# Patient Record
Sex: Male | Born: 1935 | Race: Black or African American | Hispanic: No | Marital: Married | Smoking: Never smoker
Health system: Southern US, Community
[De-identification: ages and names within clinical notes are randomized; demographics above are authoritative.]

## PROBLEM LIST (undated history)

## (undated) DIAGNOSIS — F0280 Dementia in other diseases classified elsewhere without behavioral disturbance: Secondary | ICD-10-CM

## (undated) DIAGNOSIS — I2510 Atherosclerotic heart disease of native coronary artery without angina pectoris: Secondary | ICD-10-CM

## (undated) DIAGNOSIS — K219 Gastro-esophageal reflux disease without esophagitis: Secondary | ICD-10-CM

## (undated) DIAGNOSIS — E538 Deficiency of other specified B group vitamins: Secondary | ICD-10-CM

## (undated) DIAGNOSIS — G309 Alzheimer's disease, unspecified: Secondary | ICD-10-CM

## (undated) DIAGNOSIS — F329 Major depressive disorder, single episode, unspecified: Secondary | ICD-10-CM

## (undated) DIAGNOSIS — F32A Depression, unspecified: Secondary | ICD-10-CM

## (undated) DIAGNOSIS — I10 Essential (primary) hypertension: Secondary | ICD-10-CM

## (undated) DIAGNOSIS — D649 Anemia, unspecified: Secondary | ICD-10-CM

## (undated) DIAGNOSIS — I959 Hypotension, unspecified: Secondary | ICD-10-CM

## (undated) DIAGNOSIS — F4310 Post-traumatic stress disorder, unspecified: Secondary | ICD-10-CM

## (undated) DIAGNOSIS — H409 Unspecified glaucoma: Secondary | ICD-10-CM

## (undated) HISTORY — PX: MIDDLE EAR SURGERY: SHX713

## (undated) HISTORY — DX: Atherosclerotic heart disease of native coronary artery without angina pectoris: I25.10

## (undated) HISTORY — DX: Essential (primary) hypertension: I10

## (undated) HISTORY — DX: Alzheimer's disease, unspecified: G30.9

## (undated) HISTORY — DX: Gastro-esophageal reflux disease without esophagitis: K21.9

## (undated) HISTORY — DX: Dementia in other diseases classified elsewhere without behavioral disturbance: F02.80

## (undated) HISTORY — PX: THYROID SURGERY: SHX805

## (undated) HISTORY — PX: HERNIA REPAIR: SHX51

---

## 1997-12-21 ENCOUNTER — Other Ambulatory Visit: Admission: RE | Admit: 1997-12-21 | Discharge: 1997-12-21 | Payer: Self-pay | Admitting: Oncology

## 1998-04-15 ENCOUNTER — Inpatient Hospital Stay (HOSPITAL_COMMUNITY): Admission: EM | Admit: 1998-04-15 | Discharge: 1998-04-17 | Payer: Self-pay | Admitting: Emergency Medicine

## 1998-04-15 ENCOUNTER — Encounter: Payer: Self-pay | Admitting: Emergency Medicine

## 1998-04-16 ENCOUNTER — Encounter: Payer: Self-pay | Admitting: Emergency Medicine

## 1998-04-24 ENCOUNTER — Encounter: Admission: RE | Admit: 1998-04-24 | Discharge: 1998-04-24 | Payer: Self-pay | Admitting: Internal Medicine

## 1998-09-14 ENCOUNTER — Encounter: Payer: Self-pay | Admitting: Emergency Medicine

## 1998-09-14 ENCOUNTER — Inpatient Hospital Stay (HOSPITAL_COMMUNITY): Admission: EM | Admit: 1998-09-14 | Discharge: 1998-09-16 | Payer: Self-pay | Admitting: Emergency Medicine

## 1998-09-14 ENCOUNTER — Encounter: Payer: Self-pay | Admitting: Internal Medicine

## 1998-10-17 ENCOUNTER — Ambulatory Visit (HOSPITAL_COMMUNITY): Admission: RE | Admit: 1998-10-17 | Discharge: 1998-10-17 | Payer: Self-pay | Admitting: Cardiology

## 1998-12-24 ENCOUNTER — Ambulatory Visit (HOSPITAL_BASED_OUTPATIENT_CLINIC_OR_DEPARTMENT_OTHER): Admission: RE | Admit: 1998-12-24 | Discharge: 1998-12-24 | Payer: Self-pay | Admitting: General Surgery

## 1999-01-09 ENCOUNTER — Encounter: Payer: Self-pay | Admitting: Emergency Medicine

## 1999-01-09 ENCOUNTER — Emergency Department (HOSPITAL_COMMUNITY): Admission: EM | Admit: 1999-01-09 | Discharge: 1999-01-09 | Payer: Self-pay | Admitting: Emergency Medicine

## 2002-08-01 ENCOUNTER — Emergency Department (HOSPITAL_COMMUNITY): Admission: EM | Admit: 2002-08-01 | Discharge: 2002-08-01 | Payer: Self-pay | Admitting: Emergency Medicine

## 2005-01-03 ENCOUNTER — Emergency Department (HOSPITAL_COMMUNITY): Admission: EM | Admit: 2005-01-03 | Discharge: 2005-01-04 | Payer: Self-pay | Admitting: Emergency Medicine

## 2005-01-28 ENCOUNTER — Ambulatory Visit: Payer: Self-pay | Admitting: Oncology

## 2006-01-22 ENCOUNTER — Ambulatory Visit: Payer: Self-pay | Admitting: Oncology

## 2006-02-25 LAB — COMPREHENSIVE METABOLIC PANEL
Albumin: 4 g/dL (ref 3.5–5.2)
BUN: 20 mg/dL (ref 6–23)
CO2: 29 mEq/L (ref 19–32)
Glucose, Bld: 70 mg/dL (ref 70–99)
Sodium: 142 mEq/L (ref 135–145)
Total Bilirubin: 0.6 mg/dL (ref 0.3–1.2)
Total Protein: 6.5 g/dL (ref 6.0–8.3)

## 2006-02-25 LAB — CBC WITH DIFFERENTIAL/PLATELET
BASO%: 0.5 % (ref 0.0–2.0)
HCT: 36 % — ABNORMAL LOW (ref 38.7–49.9)
LYMPH%: 31.4 % (ref 14.0–48.0)
MCH: 30.3 pg (ref 28.0–33.4)
MCHC: 33.9 g/dL (ref 32.0–35.9)
MCV: 89.5 fL (ref 81.6–98.0)
MONO#: 0.2 10*3/uL (ref 0.1–0.9)
MONO%: 7.7 % (ref 0.0–13.0)
NEUT%: 59.3 % (ref 40.0–75.0)
Platelets: 163 10*3/uL (ref 145–400)

## 2006-02-25 LAB — LACTATE DEHYDROGENASE: LDH: 177 U/L (ref 94–250)

## 2006-02-25 LAB — PSA: PSA: 0.01 ng/mL — ABNORMAL LOW (ref 0.10–4.00)

## 2008-03-06 ENCOUNTER — Emergency Department (HOSPITAL_COMMUNITY): Admission: EM | Admit: 2008-03-06 | Discharge: 2008-03-06 | Payer: Self-pay | Admitting: Emergency Medicine

## 2008-06-02 ENCOUNTER — Ambulatory Visit: Payer: Self-pay | Admitting: Internal Medicine

## 2008-06-02 ENCOUNTER — Inpatient Hospital Stay (HOSPITAL_COMMUNITY): Admission: EM | Admit: 2008-06-02 | Discharge: 2008-06-04 | Payer: Self-pay | Admitting: Emergency Medicine

## 2009-05-23 ENCOUNTER — Emergency Department (HOSPITAL_COMMUNITY): Admission: EM | Admit: 2009-05-23 | Discharge: 2009-05-23 | Payer: Self-pay | Admitting: Emergency Medicine

## 2009-06-10 ENCOUNTER — Inpatient Hospital Stay (HOSPITAL_COMMUNITY): Admission: EM | Admit: 2009-06-10 | Discharge: 2009-06-11 | Payer: Self-pay | Admitting: Emergency Medicine

## 2009-06-23 ENCOUNTER — Emergency Department (HOSPITAL_COMMUNITY): Admission: EM | Admit: 2009-06-23 | Discharge: 2009-06-23 | Payer: Self-pay | Admitting: Family Medicine

## 2009-06-26 ENCOUNTER — Ambulatory Visit: Payer: Self-pay | Admitting: Internal Medicine

## 2009-06-27 ENCOUNTER — Inpatient Hospital Stay (HOSPITAL_COMMUNITY): Admission: EM | Admit: 2009-06-27 | Discharge: 2009-07-12 | Payer: Self-pay | Admitting: Emergency Medicine

## 2010-10-20 LAB — BASIC METABOLIC PANEL
BUN: 27 mg/dL — ABNORMAL HIGH (ref 6–23)
CO2: 31 mEq/L (ref 19–32)
Calcium: 8.8 mg/dL (ref 8.4–10.5)
Chloride: 102 mEq/L (ref 96–112)
Creatinine, Ser: 1.02 mg/dL (ref 0.4–1.5)
GFR calc Af Amer: >60 mL/min (ref >60)
GFR calc non Af Amer: >60 mL/min (ref >60)
Glucose, Bld: 98 mg/dL (ref 70–99)
Potassium: 4.3 mEq/L (ref 3.5–5.1)
Sodium: 139 mEq/L (ref 135–145)

## 2010-10-21 LAB — CK TOTAL AND CKMB (NOT AT ARMC)
CK, MB: 2.6 ng/mL (ref 0.3–4.0)
CK, MB: 3 ng/mL (ref 0.3–4.0)
Relative Index: 2.3 (ref 0.0–2.5)
Relative Index: 2.5 (ref 0.0–2.5)
Relative Index: INVALID (ref 0.0–2.5)
Total CK: 111 U/L (ref 7–232)
Total CK: 118 U/L (ref 7–232)
Total CK: 96 U/L (ref 7–232)

## 2010-10-21 LAB — TROPONIN I
Troponin I: 0.01 ng/mL (ref 0.00–0.06)
Troponin I: 0.02 ng/mL (ref 0.00–0.06)

## 2010-10-21 LAB — POCT I-STAT, CHEM 8
BUN: 22 mg/dL (ref 6–23)
Calcium, Ion: 1.18 mmol/L (ref 1.12–1.32)
Chloride: 106 mEq/L (ref 96–112)
HCT: 39 % (ref 39.0–52.0)
Hemoglobin: 13.3 g/dL (ref 13.0–17.0)

## 2010-10-21 LAB — POCT CARDIAC MARKERS
CKMB, poc: 3 ng/mL (ref 1.0–8.0)
Myoglobin, poc: 123 ng/mL (ref 12–200)
Troponin i, poc: <0.05 ng/mL (ref 0.00–0.09)

## 2010-10-21 LAB — GLUCOSE, CAPILLARY: Glucose-Capillary: 104 mg/dL — ABNORMAL HIGH (ref 70–99)

## 2010-10-21 LAB — LIPID PANEL
Cholesterol: 166 mg/dL (ref 0–200)
HDL: 39 mg/dL — ABNORMAL LOW (ref >39)
Triglycerides: 78 mg/dL (ref <150)
VLDL: 16 mg/dL (ref 0–40)

## 2010-10-22 LAB — COMPREHENSIVE METABOLIC PANEL
AST: 22 U/L (ref 0–37)
Alkaline Phosphatase: 75 U/L (ref 39–117)
BUN: 22 mg/dL (ref 6–23)
CO2: 27 mEq/L (ref 19–32)
Calcium: 8.3 mg/dL — ABNORMAL LOW (ref 8.4–10.5)
Chloride: 104 mEq/L (ref 96–112)
GFR calc Af Amer: >60 mL/min (ref >60)
GFR calc non Af Amer: 52 mL/min — ABNORMAL LOW (ref >60)
Potassium: 4.1 mEq/L (ref 3.5–5.1)
Sodium: 136 mEq/L (ref 135–145)

## 2010-10-22 LAB — URINE CULTURE
Colony Count: 35000
Colony Count: NO GROWTH

## 2010-10-22 LAB — URINE MICROSCOPIC-ADD ON

## 2010-10-22 LAB — URINALYSIS, ROUTINE W REFLEX MICROSCOPIC
Bilirubin Urine: NEGATIVE
Glucose, UA: NEGATIVE mg/dL
Glucose, UA: NEGATIVE mg/dL
Ketones, ur: 15 mg/dL — AB
Ketones, ur: 15 mg/dL — AB
Protein, ur: 30 mg/dL — AB
Protein, ur: 100 mg/dL — AB
Urobilinogen, UA: 1 mg/dL (ref 0.0–1.0)

## 2010-10-22 LAB — RAPID URINE DRUG SCREEN, HOSP PERFORMED
Amphetamines: POSITIVE — AB
Barbiturates: NOT DETECTED
Benzodiazepines: NOT DETECTED
Cocaine: NOT DETECTED

## 2010-10-22 LAB — CARDIAC PANEL(CRET KIN+CKTOT+MB+TROPI)
Relative Index: INVALID (ref 0.0–2.5)
Relative Index: INVALID (ref 0.0–2.5)
Total CK: 94 U/L (ref 7–232)
Total CK: 95 U/L (ref 7–232)
Troponin I: 0.01 ng/mL (ref 0.00–0.06)

## 2010-10-22 LAB — POCT I-STAT, CHEM 8
BUN: 20 mg/dL (ref 6–23)
Calcium, Ion: 1.12 mmol/L (ref 1.12–1.32)
Creatinine, Ser: 1.1 mg/dL (ref 0.4–1.5)
Glucose, Bld: 109 mg/dL — ABNORMAL HIGH (ref 70–99)
Hemoglobin: 14.3 g/dL (ref 13.0–17.0)
TCO2: 26 mmol/L (ref 0–100)

## 2010-10-22 LAB — DIFFERENTIAL
Basophils Absolute: 0 10*3/uL (ref 0.0–0.1)
Lymphocytes Relative: 8 % — ABNORMAL LOW (ref 12–46)
Monocytes Absolute: 0.4 10*3/uL (ref 0.1–1.0)
Neutro Abs: 6.2 10*3/uL (ref 1.7–7.7)

## 2010-10-22 LAB — CBC
Platelets: 173 10*3/uL (ref 150–400)
RBC: 4.32 MIL/uL (ref 4.22–5.81)
RDW: 13.3 % (ref 11.5–15.5)
WBC: 7.2 10*3/uL (ref 4.0–10.5)

## 2010-10-22 LAB — D-DIMER, QUANTITATIVE: D-Dimer, Quant: 0.93 ug/mL-FEU — ABNORMAL HIGH (ref 0.00–0.48)

## 2010-10-22 LAB — POCT CARDIAC MARKERS
CKMB, poc: 2.1 ng/mL (ref 1.0–8.0)
Troponin i, poc: <0.05 ng/mL (ref 0.00–0.09)

## 2010-10-22 LAB — POCT I-STAT 3, VENOUS BLOOD GAS (G3P V)
O2 Saturation: 31 %
TCO2: 36 mmol/L (ref 0–100)
pCO2, Ven: 59.2 mmHg — ABNORMAL HIGH (ref 45.0–50.0)
pO2, Ven: 20 mmHg — CL (ref 30.0–45.0)

## 2010-10-22 LAB — AMMONIA: Ammonia: 28 umol/L (ref 11–35)

## 2010-10-22 LAB — T4, FREE: Free T4: 0.95 ng/dL (ref 0.80–1.80)

## 2010-12-02 NOTE — H&P (Signed)
NAME:  Scott Caldwell, Scott Caldwell               ACCOUNT NO.:  [id number]   MEDICAL RECORD NO.:  [id number]          PATIENT TYPE:  INP   LOCATION:  1441                         FACILITY:  [facility]   PHYSICIAN:  Georgina Quint. Plotnikov, MDDATE OF BIRTH:  November 01, 1926   DATE OF ADMISSION:  06/02/2008  DATE OF DISCHARGE:                              HISTORY & PHYSICAL   CHIEF COMPLAINT:  Diarrhea, weakness.   HISTORY OF PRESENT ILLNESS:  The patient is a 77 year old male with  dementia who presents to the ER by ambulance with 2 days of severe  diarrhea with stool and urine incontinence.  The symptoms started  yesterday when he had quite a bit of runny nose.  Later he started to  have diarrhea and fell last night.  He was found by the family on the  floor incontinent of stool and urine.  Since then, had multiple episodes  of large loose stools and diarrhea, and incontinence.  Today he  developed high fever and was getting increasingly confused.  Refused to  eat or drink on liquids.  The family also states that he was complaining  of pain in the right hip.  They deny recent antibiotics.   PAST MEDICAL HISTORY:  Dementia.  GERD.  Remote history of head injury.   CURRENT MEDICATIONS:  Aspirin, clonazepam, Tylenol, ranitidine,  citalopram.   ALLERGIES:  None.   SOCIAL HISTORY:  He lives with his wife.   FAMILY HISTORY:  Positive for hypertension.   REVIEW OF SYSTEMS:  Obtained from the son and daughter.  Again, running  nose prior to this gastrointestinal illness.  High fever today.  Right  hip pain.  Generalized deconditioning.  Unable to walk today.  The rest  of the 18-point review of systems is as above or negative.   PHYSICAL:  CONSTITUTIONAL:  He is in no acute distress.  Appears tired.  HEENT:  With dry oral mucosa, erythematous throat.  VITAL SIGNS:  Temperature 101.9, blood pressure 134/82, heart rate 86,  respirations 16, sats 96% on room air.  NECK:  Supple.  No meningeal signs.  No  thyromegaly.  LUNGS:  Decreased breath sounds at bases.  No wheezes or rales.  HEART:  S1, S2.  Tachycardiac.  Grade 2/6 systolic murmur.  No gallop.  ABDOMEN:  Soft, nontender.  No organomegaly or masses felt.  LOWER EXTREMITIES:  Without edema.  Calves nontender.  NEUROLOGIC:  He is alert and cooperative, disoriented.  MUSCULOSKELETAL:  Both hips nontender to palpation and with range of  motion.  SKIN:  Clear without rash.  Cranial nerves II-XII grossly nonfocal.  Muscle strength grossly normal.   LABS:  Sodium 139, potassium 4.  Urinalysis with 11-20 RBCs, 0-2 WBCs.  Chest x-ray without infiltrates.  CT of the abdomen and pelvis without  acute problems.   ASSESSMENT/PLAN:  1. Acute diarrhea with stool incontinence.  IV fluids.  Lomotil.  2. Dehydration.  Will use IV fluids.  3. Acute on chronic confusion.  4. Upper respiratory tract infection.  We will put in isolation,      empiric Tamiflu.  5. Urinary  tract infection.  IV Rocephin.  6. Code status was discussed with family.  They request a Full Code.      I think it needs to be readdressed with the family later.  7. Fall on November 13, possible syncope.  No seizure activity      witnessed.  Will admit to telemetry.  8. Right hip pain per family members.  Obtain x-ray tomorrow if pain      recurs (the exam was normal).  9. Deep vein thrombosis prophylaxis.      Georgina Quint. Plotnikov, MD  Electronically Signed     AVP/MEDQ  D:  06/02/2008  T:  06/03/2008  Job:  [id number]   cc:   Theressa Millard, M.D.  Fax: [phone]

## 2010-12-05 NOTE — Discharge Summary (Signed)
NAME:  [name], [name]               ACCOUNT NO.:  [id number]   MEDICAL RECORD NO.:  [id number]          PATIENT TYPE:  INP   LOCATION:  1441                         FACILITY:  [facility]   PHYSICIAN:  Theressa Millard, M.D.    DATE OF BIRTH:  July 04, 1927   DATE OF ADMISSION:  06/02/2008  DATE OF DISCHARGE:  06/04/2008                               DISCHARGE SUMMARY   ADMITTING DIAGNOSIS:  Acute diarrheal illness.   DISCHARGE DIAGNOSES:  1. Viral gastroenteritis versus H1N1.  2. Dehydration secondary to #1.  3. Urinary incontinence and fall secondary to #2.  4. Dementia with Lewy bodies.   The patient is a 77 year old of black male who has a history of dementia  with Lewy bodies.  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   He developed diarrhea and was very weak, became incontinent of urine and  fell, and was brought to the emergency department by his family.   HOSPITAL COURSE:  In the emergency room, the patient was noted to have a  temperature of 101.9.  The possibility of H1N1 was entertained.  He was  admitted to the floor under isolation.  Initially, there was some  question of whether he might have a urinary tract infection as well and  he was temporarily on Rocephin but this was stopped after 1 to 2 doses.  His diarrhea otherwise improved and resolved in the next 36 to 48 hours.  He was given IV fluids to re-hydrate, was feeling better, and was  discharged in improved condition.  He was treated with Tamiflu at  admission and this will be continued as an outpatient.   DISCHARGE MEDICATIONS:  1. Aspirin 81 mg day daily.  2. Clonazepam hold for now.  3. Tylenol 500 mg one 3 times a day.  4. Citalopram 40 mg 1/2 tablet every day.  5. Ranitidine 150 mg b.i.d.  6. Midodrine 5 mg 3 times a day.  7. Rivastigmine 3 mg twice daily.  8. Tamiflu 75 mg b.i.d. x3 days.   FOLLOWUP:  He will call to make an  appointment to see me.  This should  occur in about 3 or 4 weeks.  At that time, we will consider resuming  clonazepam.  At this point, I did not want to do it because of questions  of whether it might be clouding his sensorium more than helping his REM  sleep behavior disorder.   DIET:  Lactose free x3 to 4 days.   ACTIVITY:  As tolerated.      Theressa Millard, M.D.  Electronically Signed     JO/MEDQ  D:  08/22/2008  T:  08/22/2008  Job:  [id number]

## 2011-03-07 IMAGING — CT CT ANGIO CHEST
2 of 6 series · 19 of 36 positions shown · IV contrast (APPLIED)
Comparison: 06/08/2009 chest x-ray

CLINICAL DATA: Elevated D-dimer.

CT ANGIOGRAPHY CHEST WITH CONTRAST
TECHNIQUE: Multidetector CT imaging of the chest was performed
using the standard protocol during bolus administration of
intravenous contrast. Multiplanar CT image reconstructions
including MIPs were obtained to evaluate the vascular anatomy.
Contrast: 100 ml intravenous Fmnipaque-HBB

[Series 8: pulm embolism 1.0 b25f thins · axial · 0.70mm/px · z∈[-448,-157]mm · 18 of 325 slices shown]
[im 17/325  lung]
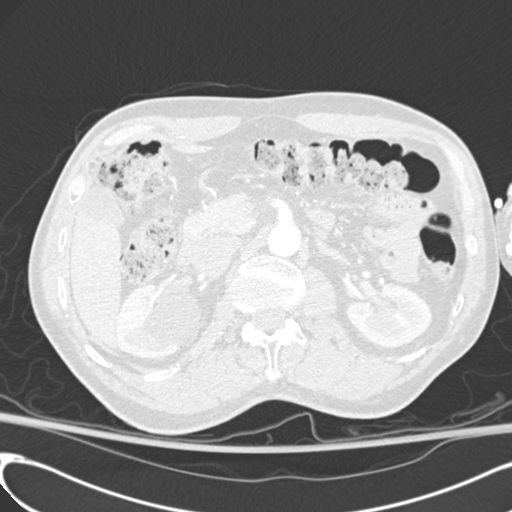
[im 33/325  mediastinal]
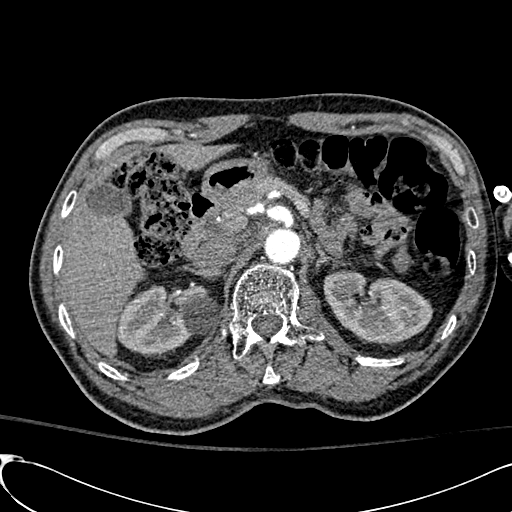
[im 49/325  lung]
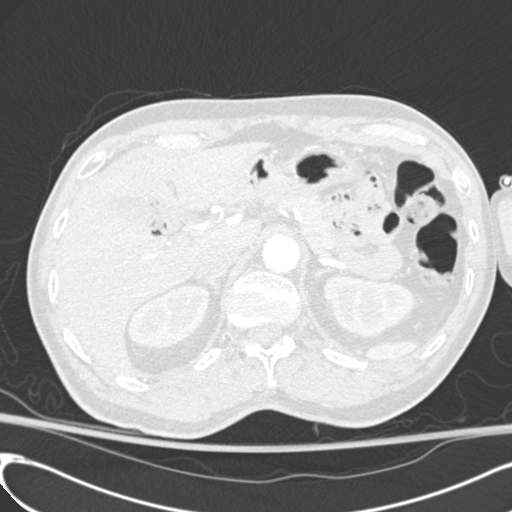
[im 65/325  mediastinal]
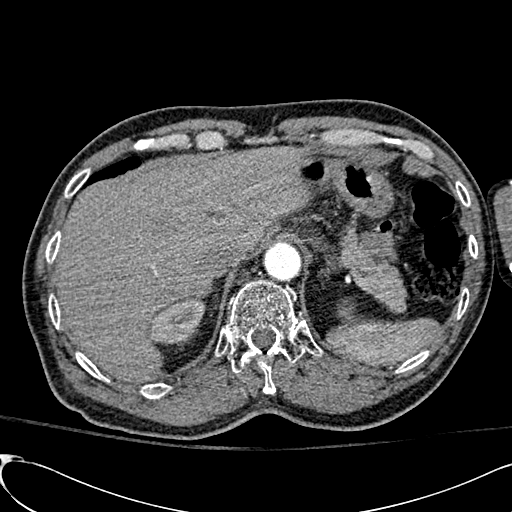
[im 82/325  lung]
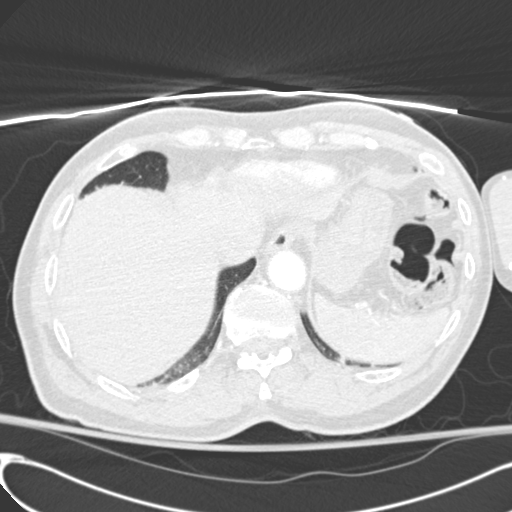
[im 98/325  mediastinal]
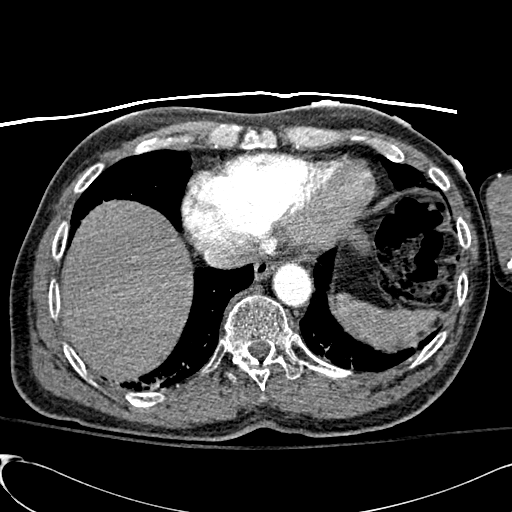
[im 114/325  lung]
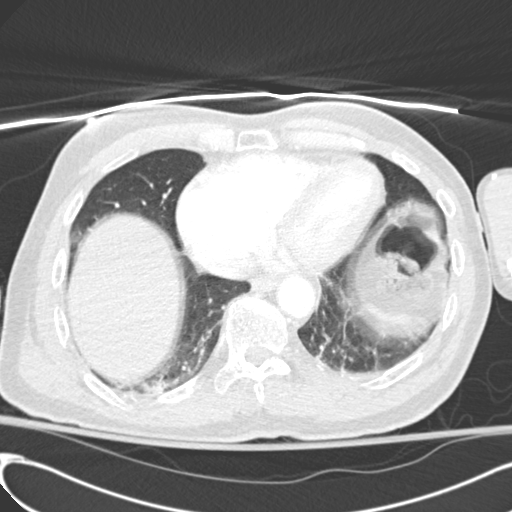
[im 130/325  mediastinal]
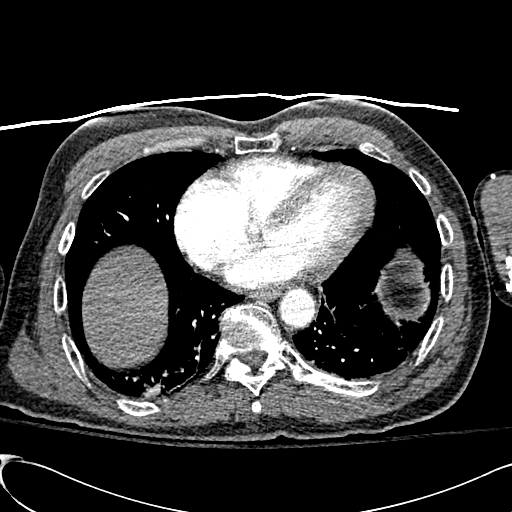
[im 146/325  lung]
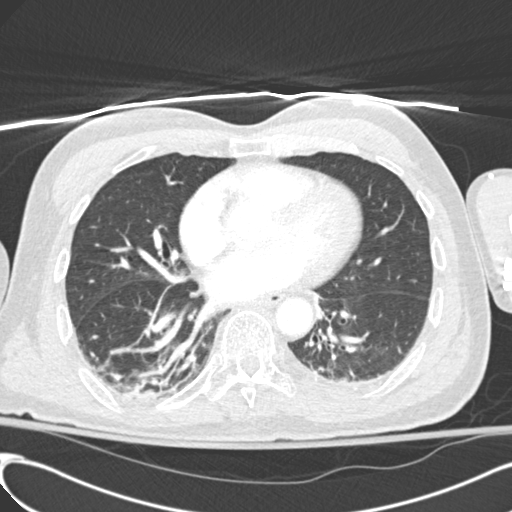
[im 179/325  mediastinal]
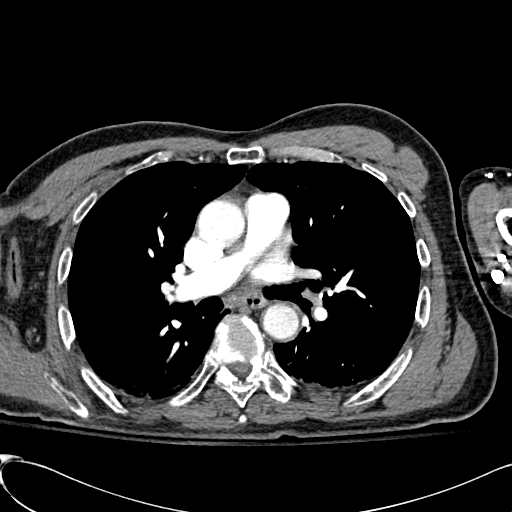
[im 195/325  lung]
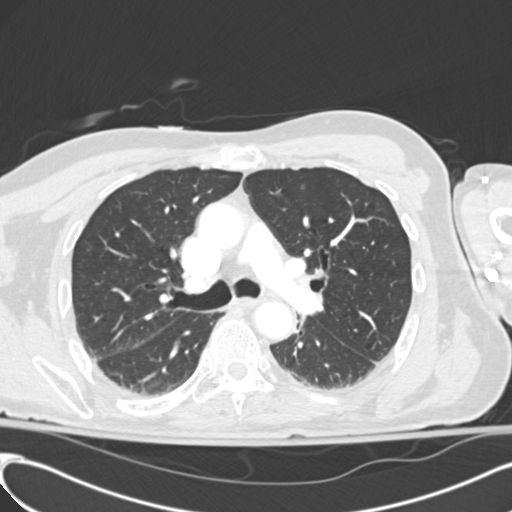
[im 211/325  mediastinal]
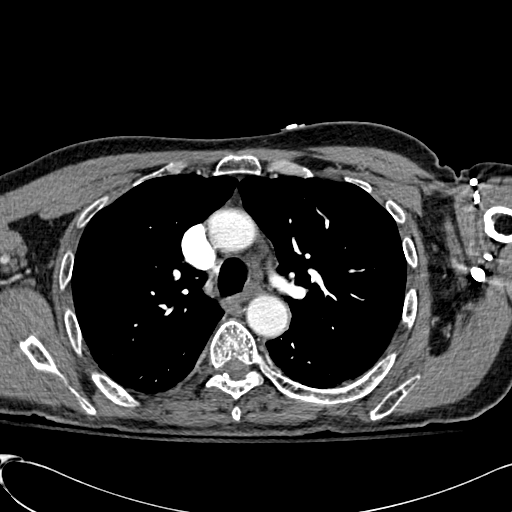
[im 227/325  lung]
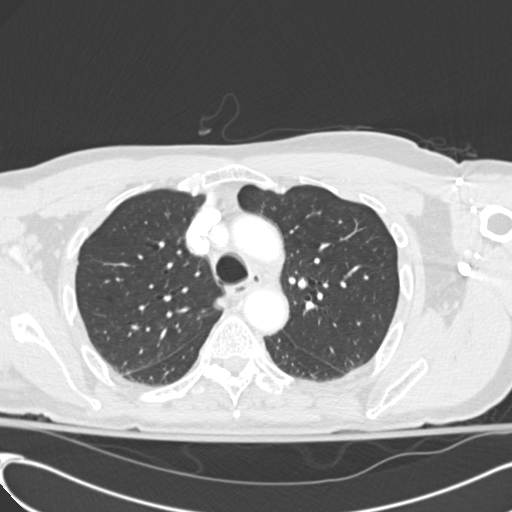
[im 244/325  mediastinal]
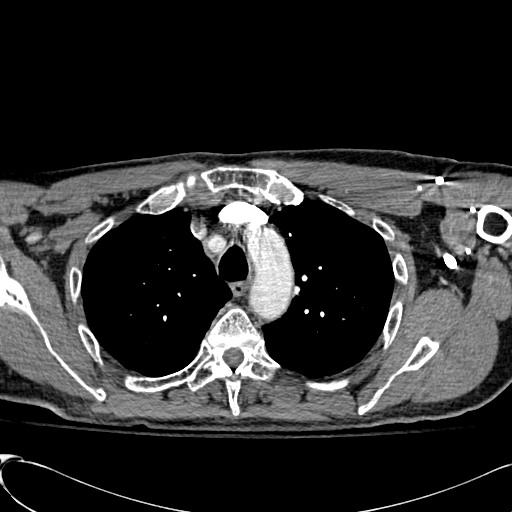
[im 260/325  lung]
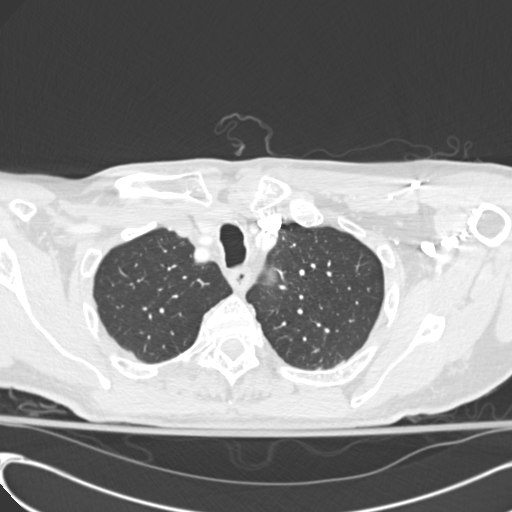
[im 276/325  mediastinal]
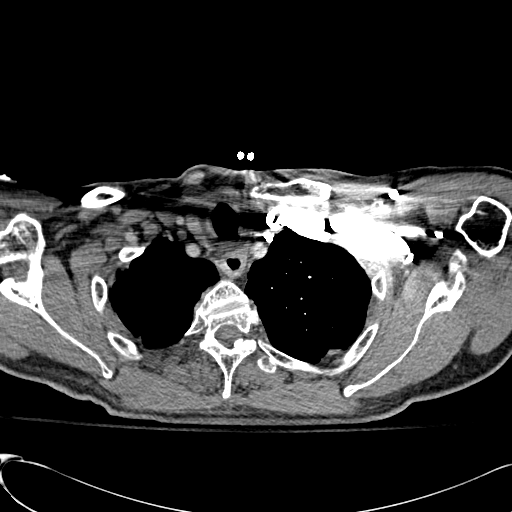
[im 292/325  lung]
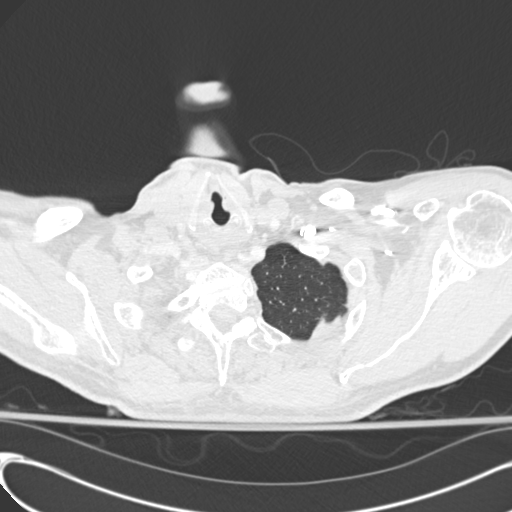
[im 308/325  mediastinal]
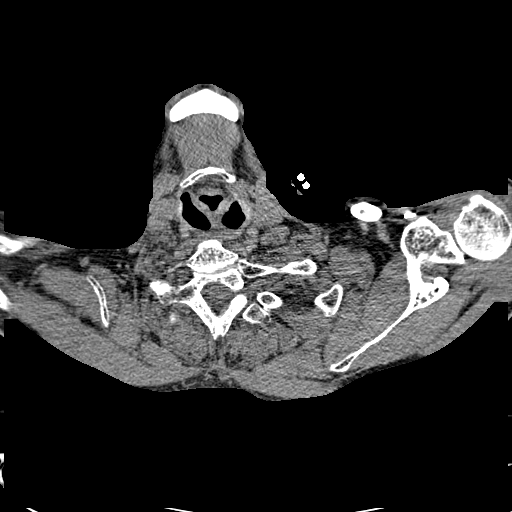

[Series 602: cor · coronal · 0.70mm/px · 1 of 66 slices shown]
[im 33/66  mediastinal]
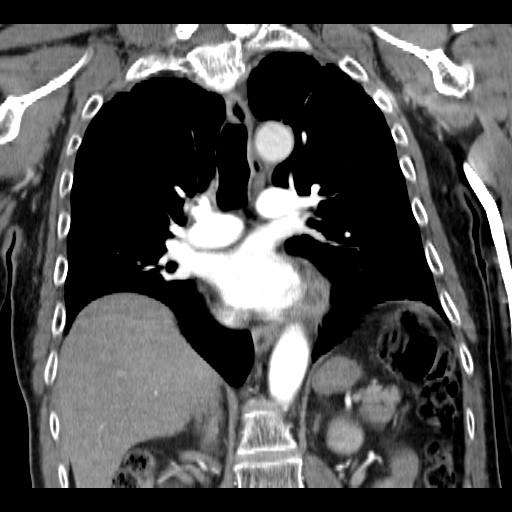

[19 of 36 positions shown; findings below may reference images not displayed]

FINDINGS: This is a technically satisfactory study.

No pulmonary emboli identified.
There is no evidence of thoracic aortic aneurysm or dissection.
Mild cardiomegaly and mild coronary artery atherosclerotic
calcifications are identified.
There are no pleural or pericardial effusions present.
No enlarged lymph nodes are identified.

Mild to moderate right lower lobe scarring/atelectasis and mild
left basilar scarring/atelectasis noted.
There is no evidence of focal airspace disease, pulmonary masses,
or consolidation.

No acute or suspicious bony abnormalities are identified.

A 1.2 cm celiac artery aneurysm is identified.
Bilateral renal cysts are also identified.

Review of the MIP images confirms the above findings.
IMPRESSION: No evidence of pulmonary emboli or thoracic aortic
aneurysm/dissection.

Bilateral lower lobe atelectasis/scarring, right greater than left.

1.2 cm celiac artery aneurysm.

## 2011-04-22 LAB — BASIC METABOLIC PANEL
BUN: 13
BUN: 8
CO2: 29
Calcium: 8.1 — ABNORMAL LOW
Calcium: 8.2 — ABNORMAL LOW
Chloride: 101
Chloride: 104
Creatinine, Ser: 0.93
Creatinine, Ser: 1.05
GFR calc Af Amer: >60
GFR calc non Af Amer: >60
Glucose, Bld: 109 — ABNORMAL HIGH
Potassium: 4
Sodium: 139

## 2011-04-22 LAB — URINALYSIS, ROUTINE W REFLEX MICROSCOPIC
Bilirubin Urine: NEGATIVE
Ketones, ur: NEGATIVE
Nitrite: NEGATIVE
Specific Gravity, Urine: 1.023
Urobilinogen, UA: 1

## 2011-04-22 LAB — CBC
HCT: 35.5 — ABNORMAL LOW
Hemoglobin: 12 — ABNORMAL LOW
MCHC: 33.7
MCV: 90.5
MCV: 91.3
Platelets: 117 — ABNORMAL LOW
Platelets: 125 — ABNORMAL LOW
RBC: 3.89 — ABNORMAL LOW
RDW: 13.5
WBC: 4.4
WBC: 5.5

## 2011-04-22 LAB — POCT I-STAT, CHEM 8
HCT: 41
Hemoglobin: 13.9
Sodium: 139
TCO2: 26

## 2011-04-22 LAB — CULTURE, BLOOD (ROUTINE X 2): Culture: NO GROWTH

## 2011-04-22 LAB — URINE CULTURE
Colony Count: NO GROWTH
Culture: NO GROWTH

## 2011-04-22 LAB — INFLUENZA A+B VIRUS AG-DIRECT(RAPID)
Inflenza A Ag: NEGATIVE
Influenza B Ag: NEGATIVE

## 2011-04-22 LAB — LACTIC ACID, PLASMA: Lactic Acid, Venous: 1.7

## 2012-07-27 HISTORY — PX: MULTIPLE TOOTH EXTRACTIONS: SHX2053

## 2012-10-05 ENCOUNTER — Other Ambulatory Visit: Payer: Self-pay | Admitting: *Deleted

## 2012-10-05 MED ORDER — HYDROCODONE-ACETAMINOPHEN 5-325 MG PO TABS: ORAL_TABLET | ORAL | Status: DC

## 2012-11-10 ENCOUNTER — Non-Acute Institutional Stay (SKILLED_NURSING_FACILITY): Payer: PRIVATE HEALTH INSURANCE | Admitting: Adult Health

## 2012-11-10 DIAGNOSIS — F0280 Dementia in other diseases classified elsewhere without behavioral disturbance: Secondary | ICD-10-CM

## 2012-11-10 DIAGNOSIS — M170 Bilateral primary osteoarthritis of knee: Secondary | ICD-10-CM

## 2012-11-10 DIAGNOSIS — K219 Gastro-esophageal reflux disease without esophagitis: Secondary | ICD-10-CM

## 2012-11-10 DIAGNOSIS — M1710 Unilateral primary osteoarthritis, unspecified knee: Secondary | ICD-10-CM

## 2012-11-10 DIAGNOSIS — I2510 Atherosclerotic heart disease of native coronary artery without angina pectoris: Secondary | ICD-10-CM

## 2012-11-10 DIAGNOSIS — I10 Essential (primary) hypertension: Secondary | ICD-10-CM

## 2012-11-28 ENCOUNTER — Non-Acute Institutional Stay (SKILLED_NURSING_FACILITY): Payer: PRIVATE HEALTH INSURANCE | Admitting: Internal Medicine

## 2012-11-28 DIAGNOSIS — I2510 Atherosclerotic heart disease of native coronary artery without angina pectoris: Secondary | ICD-10-CM

## 2012-11-28 DIAGNOSIS — G309 Alzheimer's disease, unspecified: Secondary | ICD-10-CM

## 2012-11-28 DIAGNOSIS — I10 Essential (primary) hypertension: Secondary | ICD-10-CM

## 2012-11-28 DIAGNOSIS — K219 Gastro-esophageal reflux disease without esophagitis: Secondary | ICD-10-CM

## 2012-11-28 DIAGNOSIS — F0280 Dementia in other diseases classified elsewhere without behavioral disturbance: Secondary | ICD-10-CM

## 2012-11-28 NOTE — Progress Notes (Signed)
PROGRESS NOTE  DATE: 11/28/2012  FACILITY: Nursing Home Location: [facility]  LEVEL OF CARE: SNF (31)  Routine Visit  CHIEF COMPLAINT:  Manage dementia, CAD, and GERD  HISTORY OF PRESENT ILLNESS:  REASSESSMENT OF ONGOING PROBLEM(S):  1. DEMENTIA: The dementia remaines stable and continues to function adequately in the current living environment with supervision.  The patient has had little changes in behavior. No complications noted from the medications presently being used. Patient does not follow commands.  2. CAD: The angina has been stable. The staff deny dyspnea on exertion, orthopnea, pedal edema, palpitations and paroxysmal nocturnal dyspnea. No complications noted from the medication presently being used.  3. GERD: pt's GERD is stable. staff Deny ongoing heartburn, abd. Pain, nausea or vomiting.  Currently on a PPI & tolerates it without any adverse reactions.  PAST MEDICAL HISTORY : Reviewed.  No changes.  CURRENT MEDICATIONS: Reviewed per [facility]  REVIEW OF SYSTEMS: Unobtainable due to dementia  PHYSICAL EXAMINATION  VS:  T 98.1       P 72      RR 18     BP 158/62     POX %     WT (Lb) 187  GENERAL: no acute distress, normal body habitus EYES: conjunctivae normal, sclerae normal, normal eye lids NECK: supple, trachea midline, no neck masses, no thyroid tenderness, no thyromegaly LYMPHATICS: no LAN in the neck, no supraclavicular LAN RESPIRATORY: breathing is even & unlabored, BS CTAB CARDIAC: RRR, no murmur,no extra heart sounds, no edema GI: abdomen soft, normal BS, no masses, no tenderness, no hepatomegaly, no splenomegaly PSYCHIATRIC: the patient is alert & disoriented, affect & behavior appropriate  LABS/RADIOLOGY:  5/14 BMP normal 4/14 CMP normal 1/14 CBC normal, B12 1642, folate 13.9  ASSESSMENT/PLAN:  1. Alzheimer's dementia-advanced. 2. CAD-stable. 3. GERD-stable. 4. hypertension-new problem. Lisinopril was started. We'll  review a log. 5. Orthostatic hypotension-continue midodrine. 6. pain-on routine hydrocodone. 7. Depression-continue current antidepressant.  CPT CODE: 45409

## 2012-12-19 ENCOUNTER — Non-Acute Institutional Stay (SKILLED_NURSING_FACILITY): Payer: PRIVATE HEALTH INSURANCE | Admitting: Internal Medicine

## 2012-12-19 DIAGNOSIS — K219 Gastro-esophageal reflux disease without esophagitis: Secondary | ICD-10-CM

## 2012-12-19 DIAGNOSIS — I10 Essential (primary) hypertension: Secondary | ICD-10-CM

## 2012-12-19 DIAGNOSIS — I2510 Atherosclerotic heart disease of native coronary artery without angina pectoris: Secondary | ICD-10-CM

## 2012-12-19 DIAGNOSIS — G309 Alzheimer's disease, unspecified: Secondary | ICD-10-CM

## 2012-12-19 DIAGNOSIS — F0280 Dementia in other diseases classified elsewhere without behavioral disturbance: Secondary | ICD-10-CM

## 2012-12-19 HISTORY — DX: Gastro-esophageal reflux disease without esophagitis: K21.9

## 2012-12-19 HISTORY — DX: Alzheimer's disease, unspecified: F02.80

## 2012-12-19 HISTORY — DX: Essential (primary) hypertension: I10

## 2012-12-19 HISTORY — DX: Atherosclerotic heart disease of native coronary artery without angina pectoris: I25.10

## 2012-12-19 NOTE — Progress Notes (Signed)
PROGRESS NOTE  DATE: 12-19-12  FACILITY: Nursing Home Location: [facility]  LEVEL OF CARE: SNF (31)  Routine Visit  CHIEF COMPLAINT:  Manage dementia, CAD, and GERD  HISTORY OF PRESENT ILLNESS:  REASSESSMENT OF ONGOING PROBLEM(S):  1. DEMENTIA: The dementia remaines stable and continues to function adequately in the current living environment with supervision.  The patient has had little changes in behavior. No complications noted from the medications presently being used. Patient does not follow commands.  2. CAD: The angina has been stable. The staff deny dyspnea on exertion, orthopnea, pedal edema, palpitations and paroxysmal nocturnal dyspnea. No complications noted from the medication presently being used.  3. GERD: pt's GERD is stable. staff Deny ongoing heartburn, abd. Pain, nausea or vomiting.  Currently on a PPI & tolerates it without any adverse reactions.  PAST MEDICAL HISTORY : Reviewed.  No changes.  CURRENT MEDICATIONS: Reviewed per [facility]  REVIEW OF SYSTEMS: Unobtainable due to dementia  PHYSICAL EXAMINATION  VS:  T 97.9      P 68      RR 18     BP 124/84     POX %     WT (Lb) 187  GENERAL: no acute distress, normal body habitus EYES: conjunctivae normal, sclerae normal, normal eye lids NECK: supple, trachea midline, no neck masses, no thyroid tenderness, no thyromegaly LYMPHATICS: no LAN in the neck, no supraclavicular LAN RESPIRATORY: breathing is even & unlabored, BS CTAB CARDIAC: RRR, no murmur,no extra heart sounds, no edema GI: abdomen soft, normal BS, no masses, no tenderness, no hepatomegaly, no splenomegaly PSYCHIATRIC: the patient is alert & disoriented, affect & behavior appropriate  LABS/RADIOLOGY:  5/14 BMP normal 4/14 CMP normal 1/14 CBC normal, B12 1642, folate 13.9  ASSESSMENT/PLAN:  1. Alzheimer's dementia-advanced. 2. CAD-stable. 3. GERD-stable. 4. hypertension-well controlled. 5. Orthostatic hypotension-d/c  midodrine and monitor. 6. pain-on routine hydrocodone. 7. Depression-continue current antidepressant.  CPT CODE: 16109

## 2013-01-22 ENCOUNTER — Emergency Department (HOSPITAL_COMMUNITY): Payer: Medicare Other

## 2013-01-22 ENCOUNTER — Encounter (HOSPITAL_COMMUNITY): Payer: Self-pay | Admitting: Emergency Medicine

## 2013-01-22 ENCOUNTER — Emergency Department (HOSPITAL_COMMUNITY)
Admission: EM | Admit: 2013-01-22 | Discharge: 2013-01-22 | Disposition: A | Discharge status: Skilled Nursing Facility | Payer: Medicare Other | Attending: Emergency Medicine | Admitting: Emergency Medicine

## 2013-01-22 ENCOUNTER — Other Ambulatory Visit (HOSPITAL_COMMUNITY): Payer: Medicare Other

## 2013-01-22 DIAGNOSIS — R4182 Altered mental status, unspecified: Secondary | ICD-10-CM | POA: Insufficient documentation

## 2013-01-22 DIAGNOSIS — F02818 Dementia in other diseases classified elsewhere, unspecified severity, with other behavioral disturbance: Secondary | ICD-10-CM | POA: Insufficient documentation

## 2013-01-22 DIAGNOSIS — I2510 Atherosclerotic heart disease of native coronary artery without angina pectoris: Secondary | ICD-10-CM | POA: Insufficient documentation

## 2013-01-22 DIAGNOSIS — F0281 Dementia in other diseases classified elsewhere with behavioral disturbance: Secondary | ICD-10-CM | POA: Insufficient documentation

## 2013-01-22 DIAGNOSIS — K219 Gastro-esophageal reflux disease without esophagitis: Secondary | ICD-10-CM | POA: Insufficient documentation

## 2013-01-22 DIAGNOSIS — Z79899 Other long term (current) drug therapy: Secondary | ICD-10-CM | POA: Insufficient documentation

## 2013-01-22 DIAGNOSIS — Z8659 Personal history of other mental and behavioral disorders: Secondary | ICD-10-CM | POA: Insufficient documentation

## 2013-01-22 DIAGNOSIS — F0391 Unspecified dementia with behavioral disturbance: Secondary | ICD-10-CM

## 2013-01-22 DIAGNOSIS — G309 Alzheimer's disease, unspecified: Secondary | ICD-10-CM | POA: Insufficient documentation

## 2013-01-22 DIAGNOSIS — I10 Essential (primary) hypertension: Secondary | ICD-10-CM | POA: Insufficient documentation

## 2013-01-22 DIAGNOSIS — F0280 Dementia in other diseases classified elsewhere without behavioral disturbance: Secondary | ICD-10-CM | POA: Insufficient documentation

## 2013-01-22 HISTORY — DX: Gastro-esophageal reflux disease without esophagitis: K21.9

## 2013-01-22 HISTORY — DX: Atherosclerotic heart disease of native coronary artery without angina pectoris: I25.10

## 2013-01-22 HISTORY — DX: Dementia in other diseases classified elsewhere, unspecified severity, without behavioral disturbance, psychotic disturbance, mood disturbance, and anxiety: F02.80

## 2013-01-22 HISTORY — DX: Alzheimer's disease, unspecified: G30.9

## 2013-01-22 HISTORY — DX: Post-traumatic stress disorder, unspecified: F43.10

## 2013-01-22 HISTORY — DX: Essential (primary) hypertension: I10

## 2013-01-22 LAB — POCT I-STAT 3, ART BLOOD GAS (G3+)
Acid-base deficit: 2 mmol/L (ref 0.0–2.0)
Bicarbonate: 24.1 mEq/L — ABNORMAL HIGH (ref 20.0–24.0)
TCO2: 25 mmol/L (ref 0–100)
pO2, Arterial: 75 mmHg — ABNORMAL LOW (ref 80.0–100.0)

## 2013-01-22 LAB — GLUCOSE, CAPILLARY: Glucose-Capillary: 87 mg/dL (ref 70–99)

## 2013-01-22 LAB — CBC WITH DIFFERENTIAL/PLATELET
Basophils Relative: 0 % (ref 0–1)
Eosinophils Absolute: 0.1 10*3/uL (ref 0.0–0.7)
MCH: 28.6 pg (ref 26.0–34.0)
MCHC: 33.4 g/dL (ref 30.0–36.0)
Neutrophils Relative %: 72 % (ref 43–77)
Platelets: 142 10*3/uL — ABNORMAL LOW (ref 150–400)

## 2013-01-22 LAB — COMPREHENSIVE METABOLIC PANEL
ALT: 7 U/L (ref 0–53)
Albumin: 3.6 g/dL (ref 3.5–5.2)
Alkaline Phosphatase: 67 U/L (ref 39–117)
Potassium: 3.9 mEq/L (ref 3.5–5.1)
Sodium: 138 mEq/L (ref 135–145)
Total Protein: 7.4 g/dL (ref 6.0–8.3)

## 2013-01-22 LAB — CG4 I-STAT (LACTIC ACID): Lactic Acid, Venous: 0.94 mmol/L (ref 0.5–2.2)

## 2013-01-22 LAB — AMMONIA: Ammonia: 23 umol/L (ref 11–60)

## 2013-01-22 MED ORDER — SODIUM CHLORIDE 0.9 % IV BOLUS (SEPSIS)
2000.0000 mL | Freq: Once | INTRAVENOUS | Status: AC
  Administered 2013-01-22: 1000 mL via INTRAVENOUS

## 2013-01-22 MED ORDER — SODIUM CHLORIDE 0.9 % IV BOLUS (SEPSIS): 500.0000 mL | Freq: Once | INTRAVENOUS | Status: DC

## 2013-01-22 NOTE — ED Notes (Signed)
Dr. Fonnie Jarvis notified that this RN and EMT both are unable to obtain urine via in and out cath.

## 2013-01-22 NOTE — ED Provider Notes (Signed)
History    CSN: [id number] Arrival date & time 01/22/13  1050  First MD Initiated Contact with Patient 01/22/13 1106     Chief Complaint  Patient presents with  . Altered Mental Status   (Consider location/radiation/quality/duration/timing/severity/associated sxs/prior Treatment) HPI This 79 year old demented male at baseline is bedridden and nonverbal and cannot perform any activities of daily living it is unknown whether or not he follows any commands at baseline was apparently lasted baseline at 10:00 this morning when the nursing home checked him again he was unresponsive and not moving anything to painful stimulus was sent to the ED, here upon arrival to the room the patient opens his eyes to painful stimulus looks around the room both left and right is nonverbal except for groaning when he is checked for painful stimulus he moves all 4 extremities spontaneously, history of present illness is otherwise unobtainable due to the patient's altered mental status and baseline dementia. Past Medical History  Diagnosis Date  . Coronary artery disease   . Hypertension   . Alzheimer's dementia   . GERD (gastroesophageal reflux disease)   . PTSD (post-traumatic stress disorder)    History reviewed. No pertinent past surgical history. History reviewed. No pertinent family history. History  Substance Use Topics  . Smoking status: Not on file  . Smokeless tobacco: Not on file  . Alcohol Use: Not on file    Review of Systems  Unable to perform ROS: Dementia    Allergies  Simvastatin  Home Medications   Current Outpatient Rx  Name  Route  Sig  Dispense  Refill  . acetaminophen (TYLENOL) 325 MG tablet   Oral   Take 650 mg by mouth every 6 (six) hours as needed for pain.         Marland Kitchen aspirin 81 MG chewable tablet   Oral   Chew 81 mg by mouth daily.         . Diclofenac Sodium (PENNSAID) 2 % SOLN   Transdermal   Place 2 application onto the skin 2 (two) times daily as needed  (Knee pain).         Marland Kitchen HYDROcodone-acetaminophen (NORCO/VICODIN) 5-325 MG per tablet      Take 1 tablet once a day   30 tablet   5   . lisinopril (PRINIVIL,ZESTRIL) 5 MG tablet   Oral   Take 5 mg by mouth daily.         . Menthol, Topical Analgesic, (BIOFREEZE) 4 % GEL   Apply externally   Apply 2 application topically 2 (two) times daily. Apply to both knees twice daily         . Nutritional Supplements (BOOST BREEZE) LIQD   Oral   Take 30 mLs by mouth 3 (three) times daily. With medications         . ranitidine (ZANTAC) 150 MG tablet   Oral   Take 150 mg by mouth 2 (two) times daily.         . Travoprost, BAK Free, (TRAVATAN) 0.004 % SOLN ophthalmic solution   Both Eyes   Place 1 drop into both eyes at bedtime.          BP 141/71  Pulse 79  Temp(Src) 97.9 F (36.6 C) (Oral)  Resp 15  SpO2 97% Physical Exam  Nursing note and vitals reviewed. Constitutional:  Awake, alert, nontoxic appearance and is initially closed however his symptoms painful stimulus to any of his 4 extremities applied the patient opens her eyes looks  around the room turns his head moves all 4 extremities.  HENT:  Head: Atraumatic.  Eyes: Right eye exhibits no discharge. Left eye exhibits no discharge.  Neck: Neck supple.  Cardiovascular: Normal rate and regular rhythm.   No murmur heard. Pulmonary/Chest: Effort normal and breath sounds normal. No respiratory distress. He has no wheezes. He has no rales. He exhibits no tenderness.  Abdominal: Soft. Bowel sounds are normal. He exhibits no distension and no mass. There is no tenderness. There is no rebound and no guarding.  Musculoskeletal: He exhibits no edema and no tenderness.  Baseline ROM, no obvious new focal weakness.  Neurological: He is alert.  Mental status and motor strength appears questionably at baseline for patient since in the ED he does open his eyes and look around the room but not to command, he also moves all 4  extremities initially to painful stimulation in any of his extremities and then spontaneously as well without apparent focal or lateralizing weakness noted patient does have diffuse hypertonicity throughout his entire body. No obvious facial asymmetry, patient clenches jaw when I attempt to examine oropharynx, patient cannot cooperate for focal motor testing of sensory testing or coordination testing.  Skin: No rash noted.  Psychiatric: He has a normal mood and affect.    ED Course  Procedures (including critical care time) ECG: Normal sinus rhythm, ventricular rate 73, normal axis, normal intervals, no acute ischemic changes noted, impression normal ECG, no significant change noted compared with December 2010  Caregiver understand and agree with initial ED impression and plan with expectations set for ED visit. Pt stable in ED with no significant deterioration in condition.Family / Caregiver informed of clinical course, understand medical decision-making process, and agree with plan.Pt awake and alert during ED stay back to baseline per family. Labs Reviewed  CBC WITH DIFFERENTIAL - Abnormal; Notable for the following:    Platelets 142 (*)    All other components within normal limits  COMPREHENSIVE METABOLIC PANEL - Abnormal; Notable for the following:    Glucose, Bld 105 (*)    GFR calc non Af Amer 62 (*)    GFR calc Af Amer 72 (*)    All other components within normal limits  POCT I-STAT 3, BLOOD GAS (G3+) - Abnormal; Notable for the following:    pO2, Arterial 75.0 (*)    Bicarbonate 24.1 (*)    All other components within normal limits  GLUCOSE, CAPILLARY  AMMONIA  CG4 I-STAT (LACTIC ACID)   Ct Head Wo Contrast  01/22/2013   *RADIOLOGY REPORT*  Clinical Data: Altered mental status.  Baseline dementia.  CT HEAD WITHOUT CONTRAST  Technique:  Contiguous axial images were obtained from the base of the skull through the vertex without contrast.  Comparison: 06/08/2009.  Findings: There is  no evidence for acute infarction, intracranial hemorrhage, mass lesion, hydrocephalus, or extra-axial fluid. Moderate to severe atrophy is present.  Advanced chronic microvascular ischemic change is present in the periventricular and subcortical white matter.  Calvarium intact.  Chronic sphenoethmoid sinus disease is worse on the right.  Vascular calcification.  Compared with priors, the small vessel disease and atrophy has progressed from 2010.  IMPRESSION: Advanced atrophy and chronic microvascular ischemic change.  No acute intracranial findings.   Original Report Authenticated By: Davonna Belling, M.D.   Dg Chest Port 1 View  01/22/2013   *RADIOLOGY REPORT*  Clinical Data: Mental status change  PORTABLE CHEST - 1 VIEW  Comparison: 06/26/2009  Findings: Normal heart size.  The lung volumes are low and there is asymmetric elevation of the right hemidiaphragm atelectasis noted in both lung bases.  No airspace consolidation.  IMPRESSION:  1.  Low lung volumes and bibasilar atelectasis.   Original Report Authenticated By: Signa Kell, M.D.   1. Altered mental status   2. Dementia, with behavioral disturbance     MDM  I doubt any other EMC precluding discharge at this time including, but not necessarily limited to the following:sepsis, ICH.  Hurman Horn, MD 01/22/13 2221

## 2013-01-22 NOTE — ED Notes (Signed)
EKG and old EKG given to Dr.Bednar and copy placed in patient chart

## 2013-01-22 NOTE — ED Notes (Signed)
CBG 87. 

## 2013-01-22 NOTE — ED Notes (Signed)
Pt presents to ED from [facility] nursing home via EMS with altered mental status. Staff at nursing home called for unresponsive. Pt has advanced alzheimer's and normally does not walk, talk, or do anything for himself. Staff states he took meds at 10 am and the next time they checked on him he was unresponsive and would not open his eyes. Pt responds to pain with ems and open his eyes.

## 2013-01-22 NOTE — ED Notes (Signed)
2nd NS started on pt.

## 2013-01-30 ENCOUNTER — Non-Acute Institutional Stay (SKILLED_NURSING_FACILITY): Payer: PRIVATE HEALTH INSURANCE | Admitting: Internal Medicine

## 2013-01-30 DIAGNOSIS — F0280 Dementia in other diseases classified elsewhere without behavioral disturbance: Secondary | ICD-10-CM

## 2013-01-30 DIAGNOSIS — I10 Essential (primary) hypertension: Secondary | ICD-10-CM

## 2013-01-30 DIAGNOSIS — I2510 Atherosclerotic heart disease of native coronary artery without angina pectoris: Secondary | ICD-10-CM

## 2013-01-30 DIAGNOSIS — G309 Alzheimer's disease, unspecified: Secondary | ICD-10-CM

## 2013-01-30 DIAGNOSIS — K219 Gastro-esophageal reflux disease without esophagitis: Secondary | ICD-10-CM

## 2013-01-30 NOTE — Progress Notes (Signed)
PROGRESS NOTE  DATE: 01-30-13  FACILITY: Nursing Home Location: [facility]  LEVEL OF CARE: SNF (31)  Routine Visit  CHIEF COMPLAINT:  Manage dementia, CAD, and GERD  HISTORY OF PRESENT ILLNESS:  REASSESSMENT OF ONGOING PROBLEM(S):  DEMENTIA: The dementia remaines stable and continues to function adequately in the current living environment with supervision.  The patient has had little changes in behavior. No complications noted from the medications presently being used. Patient does not follow commands.  CAD: The angina has been stable. The staff deny dyspnea on exertion, orthopnea, pedal edema, palpitations and paroxysmal nocturnal dyspnea. No complications noted from the medication presently being used.  GERD: pt's GERD is stable. staff Deny ongoing heartburn, abd. Pain, nausea or vomiting.  Currently on a PPI & tolerates it without any adverse reactions.  PAST MEDICAL HISTORY : Reviewed.  No changes.  CURRENT MEDICATIONS: Reviewed per [facility]  REVIEW OF SYSTEMS: Unobtainable due to dementia  PHYSICAL EXAMINATION  VS:  T 98      P 80      RR 20     BP 120/82     POX %     WT (Lb) 182  GENERAL: no acute distress, normal body habitus EYES: conjunctivae normal, sclerae normal, normal eye lids NECK: supple, trachea midline, no neck masses, no thyroid tenderness, no thyromegaly LYMPHATICS: no LAN in the neck, no supraclavicular LAN RESPIRATORY: breathing is even & unlabored, BS CTAB CARDIAC: RRR, no murmur,no extra heart sounds, no edema GI: abdomen soft, normal BS, no masses, no tenderness, no hepatomegaly, no splenomegaly PSYCHIATRIC: the patient is alert & disoriented, affect & behavior appropriate  LABS/RADIOLOGY:  5/14 BMP normal 4/14 CMP normal 1/14 CBC normal, B12 1642, folate 13.9  ASSESSMENT/PLAN:  Alzheimer's dementia-advanced. CAD-stable. GERD-stable. hypertension-well controlled. pain-on routine hydrocodone. Depression-continue current  antidepressant. Check CBC.  CPT CODE: 44010

## 2013-03-06 ENCOUNTER — Non-Acute Institutional Stay (SKILLED_NURSING_FACILITY): Payer: PRIVATE HEALTH INSURANCE | Admitting: Internal Medicine

## 2013-03-06 DIAGNOSIS — I10 Essential (primary) hypertension: Secondary | ICD-10-CM

## 2013-03-06 DIAGNOSIS — I2510 Atherosclerotic heart disease of native coronary artery without angina pectoris: Secondary | ICD-10-CM

## 2013-03-06 DIAGNOSIS — K219 Gastro-esophageal reflux disease without esophagitis: Secondary | ICD-10-CM

## 2013-03-06 DIAGNOSIS — F0280 Dementia in other diseases classified elsewhere without behavioral disturbance: Secondary | ICD-10-CM

## 2013-03-11 NOTE — Progress Notes (Signed)
PROGRESS NOTE  DATE: 03-06-13  FACILITY: Nursing Home Location: [facility]  LEVEL OF CARE: SNF (31)  Routine Visit  CHIEF COMPLAINT:  Manage dementia, CAD, and GERD  HISTORY OF PRESENT ILLNESS:  REASSESSMENT OF ONGOING PROBLEM(S):  DEMENTIA: The dementia remaines stable and continues to function adequately in the current living environment with supervision.  The patient has had little changes in behavior. No complications noted from the medications presently being used. Patient does not follow commands.  CAD: The angina has been stable. The staff deny dyspnea on exertion, orthopnea, pedal edema, palpitations and paroxysmal nocturnal dyspnea. No complications noted from the medication presently being used.  GERD: pt's GERD is stable. staff Deny ongoing heartburn, abd. Pain, nausea or vomiting.  Currently on a PPI & tolerates it without any adverse reactions.  PAST MEDICAL HISTORY : Reviewed.  No changes.  CURRENT MEDICATIONS: Reviewed per [facility]  REVIEW OF SYSTEMS: Unobtainable due to dementia  PHYSICAL EXAMINATION  VS:  T 98.5      P 72      RR 18     BP 132/76     POX %     WT (Lb) 187  GENERAL: no acute distress, normal body habitus EYES: conjunctivae normal, sclerae normal, normal eye lids NECK: supple, trachea midline, no neck masses, no thyroid tenderness, no thyromegaly LYMPHATICS: no LAN in the neck, no supraclavicular LAN RESPIRATORY: breathing is even & unlabored, BS CTAB CARDIAC: RRR, no murmur,no extra heart sounds, no edema GI: abdomen soft, normal BS, no masses, no tenderness, no hepatomegaly, no splenomegaly PSYCHIATRIC: the patient is alert & disoriented, affect & behavior appropriate  LABS/RADIOLOGY:  7/14 cbc nl  5/14 BMP normal 4/14 CMP normal 1/14 CBC normal, B12 1642, folate 13.9  ASSESSMENT/PLAN:  Alzheimer's dementia-advanced. CAD-stable. GERD-stable. hypertension-well controlled. pain-on routine  hydrocodone. Depression-continue current antidepressant.  CPT CODE: 16109

## 2013-04-03 ENCOUNTER — Other Ambulatory Visit: Payer: Self-pay | Admitting: *Deleted

## 2013-04-03 ENCOUNTER — Non-Acute Institutional Stay (SKILLED_NURSING_FACILITY): Payer: PRIVATE HEALTH INSURANCE | Admitting: Internal Medicine

## 2013-04-03 DIAGNOSIS — K219 Gastro-esophageal reflux disease without esophagitis: Secondary | ICD-10-CM

## 2013-04-03 DIAGNOSIS — I2510 Atherosclerotic heart disease of native coronary artery without angina pectoris: Secondary | ICD-10-CM

## 2013-04-03 DIAGNOSIS — F0280 Dementia in other diseases classified elsewhere without behavioral disturbance: Secondary | ICD-10-CM

## 2013-04-03 DIAGNOSIS — I10 Essential (primary) hypertension: Secondary | ICD-10-CM

## 2013-04-03 MED ORDER — HYDROCODONE-ACETAMINOPHEN 5-325 MG PO TABS: ORAL_TABLET | ORAL | Status: DC

## 2013-04-03 NOTE — Progress Notes (Signed)
PROGRESS NOTE  DATE: 04-03-13  FACILITY: Nursing Home Location: [facility]  LEVEL OF CARE: SNF (31)  Routine Visit  CHIEF COMPLAINT:  Manage dementia, CAD, and GERD  HISTORY OF PRESENT ILLNESS:  REASSESSMENT OF ONGOING PROBLEM(S):  DEMENTIA: The dementia remaines stable and continues to function adequately in the current living environment with supervision.  The patient has had little changes in behavior. No complications noted from the medications presently being used. Patient does not follow commands.  CAD: The angina has been stable. The staff deny dyspnea on exertion, orthopnea, pedal edema, palpitations and paroxysmal nocturnal dyspnea. No complications noted from the medication presently being used.  GERD: pt's GERD is stable. staff Deny ongoing heartburn, abd. Pain, nausea or vomiting.  Currently on a PPI & tolerates it without any adverse reactions.  PAST MEDICAL HISTORY : Reviewed.  No changes.  CURRENT MEDICATIONS: Reviewed per [facility]  REVIEW OF SYSTEMS: Unobtainable due to dementia  PHYSICAL EXAMINATION  VS:  T 97      P 70      RR 20     BP 128/78     POX %     WT (Lb) 191  GENERAL: no acute distress, normal body habitus EYES: conjunctivae normal, sclerae normal, normal eye lids NECK: supple, trachea midline, no neck masses, no thyroid tenderness, no thyromegaly LYMPHATICS: no LAN in the neck, no supraclavicular LAN RESPIRATORY: breathing is even & unlabored, BS CTAB CARDIAC: RRR, no murmur,no extra heart sounds, no edema GI: abdomen soft, normal BS, no masses, no tenderness, no hepatomegaly, no splenomegaly PSYCHIATRIC: the patient is alert & disoriented, affect & behavior appropriate  LABS/RADIOLOGY:  9-14 CMP normal  7/14 cbc nl  5/14 BMP normal 4/14 CMP normal 1/14 CBC normal, B12 1642, folate 13.9  ASSESSMENT/PLAN:  Alzheimer's dementia-advanced. CAD-stable. GERD-stable. hypertension-well controlled. pain-on routine  hydrocodone. Depression-continue current antidepressant.  CPT CODE: 19147

## 2013-04-24 ENCOUNTER — Non-Acute Institutional Stay (SKILLED_NURSING_FACILITY): Payer: PRIVATE HEALTH INSURANCE | Admitting: Internal Medicine

## 2013-04-24 DIAGNOSIS — F0280 Dementia in other diseases classified elsewhere without behavioral disturbance: Secondary | ICD-10-CM

## 2013-04-24 DIAGNOSIS — I10 Essential (primary) hypertension: Secondary | ICD-10-CM

## 2013-04-24 DIAGNOSIS — K219 Gastro-esophageal reflux disease without esophagitis: Secondary | ICD-10-CM

## 2013-04-24 DIAGNOSIS — I2510 Atherosclerotic heart disease of native coronary artery without angina pectoris: Secondary | ICD-10-CM

## 2013-04-29 NOTE — Progress Notes (Signed)
PROGRESS NOTE  DATE: 04-24-13  FACILITY: Nursing Home Location: [facility]  LEVEL OF CARE: SNF (31)  Routine Visit  CHIEF COMPLAINT:  Manage dementia, CAD, and GERD  HISTORY OF PRESENT ILLNESS:  REASSESSMENT OF ONGOING PROBLEM(S):  DEMENTIA: The dementia remaines stable and continues to function adequately in the current living environment with supervision.  The patient has had little changes in behavior. No complications noted from the medications presently being used. Patient does not follow commands.  CAD: The angina has been stable. The staff deny dyspnea on exertion, orthopnea, pedal edema, palpitations and paroxysmal nocturnal dyspnea. No complications noted from the medication presently being used.  GERD: pt's GERD is stable. staff Deny ongoing heartburn, abd. Pain, nausea or vomiting.  Currently on a PPI & tolerates it without any adverse reactions.  PAST MEDICAL HISTORY : Reviewed.  No changes.  CURRENT MEDICATIONS: Reviewed per [facility]  REVIEW OF SYSTEMS: Unobtainable due to dementia  PHYSICAL EXAMINATION  VS:  T 97.9      P 72      RR 18     BP 140/78     POX %     WT (Lb) 191  GENERAL: no acute distress, normal body habitus EYES: conjunctivae normal, sclerae normal, normal eye lids NECK: supple, trachea midline, no neck masses, no thyroid tenderness, no thyromegaly LYMPHATICS: no LAN in the neck, no supraclavicular LAN RESPIRATORY: breathing is even & unlabored, BS CTAB CARDIAC: RRR, no murmur,no extra heart sounds, no edema GI: abdomen soft, normal BS, no masses, no tenderness, no hepatomegaly, no splenomegaly PSYCHIATRIC: the patient is alert & disoriented, affect & behavior appropriate  LABS/RADIOLOGY:  9-14 CMP normal  7/14 cbc nl  5/14 BMP normal 4/14 CMP normal 1/14 CBC normal, B12 1642, folate 13.9  ASSESSMENT/PLAN:  Alzheimer's dementia-advanced. CAD-stable. GERD-stable. hypertension-well controlled. pain-on routine  hydrocodone. Depression-continue current antidepressant.  CPT CODE: 16109

## 2013-05-23 ENCOUNTER — Non-Acute Institutional Stay (SKILLED_NURSING_FACILITY): Payer: PRIVATE HEALTH INSURANCE | Admitting: Internal Medicine

## 2013-05-23 DIAGNOSIS — I2510 Atherosclerotic heart disease of native coronary artery without angina pectoris: Secondary | ICD-10-CM

## 2013-05-23 DIAGNOSIS — K219 Gastro-esophageal reflux disease without esophagitis: Secondary | ICD-10-CM

## 2013-05-23 DIAGNOSIS — F0280 Dementia in other diseases classified elsewhere without behavioral disturbance: Secondary | ICD-10-CM

## 2013-05-23 DIAGNOSIS — I10 Essential (primary) hypertension: Secondary | ICD-10-CM

## 2013-05-23 NOTE — Progress Notes (Signed)
PROGRESS NOTE  DATE: 05-23-13  FACILITY: Nursing Home Location: [facility]  LEVEL OF CARE: SNF (31)  Routine Visit  CHIEF COMPLAINT:  Manage dementia, CAD, and GERD  HISTORY OF PRESENT ILLNESS:  REASSESSMENT OF ONGOING PROBLEM(S):  DEMENTIA: The dementia remaines stable and continues to function adequately in the current living environment with supervision.  The patient has had little changes in behavior. No complications noted from the medications presently being used. Patient does not follow commands.  CAD: The angina has been stable. The staff deny dyspnea on exertion, orthopnea, pedal edema, palpitations and paroxysmal nocturnal dyspnea. No complications noted from the medication presently being used.  GERD: pt's GERD is stable. staff Deny ongoing heartburn, abd. Pain, nausea or vomiting.  Currently on a PPI & tolerates it without any adverse reactions.  PAST MEDICAL HISTORY : Reviewed.  No changes.  CURRENT MEDICATIONS: Reviewed per [facility]  REVIEW OF SYSTEMS: Unobtainable due to dementia  PHYSICAL EXAMINATION  VS:  T 97.1      P 78      RR 18     BP 134/88     POX %     WT (Lb) 189  GENERAL: no acute distress, normal body habitus EYES: conjunctivae normal, sclerae normal, normal eye lids NECK: supple, trachea midline, no neck masses, no thyroid tenderness, no thyromegaly LYMPHATICS: no LAN in the neck, no supraclavicular LAN RESPIRATORY: breathing is even & unlabored, BS CTAB CARDIAC: RRR, no murmur,no extra heart sounds, no edema GI: abdomen soft, normal BS, no masses, no tenderness, no hepatomegaly, no splenomegaly PSYCHIATRIC: the patient is alert & disoriented, affect & behavior appropriate  LABS/RADIOLOGY:  9-14 CMP normal  7/14 cbc nl  5/14 BMP normal 4/14 CMP normal 1/14 CBC normal, B12 1642, folate 13.9  ASSESSMENT/PLAN:  Alzheimer's dementia-advanced. CAD-stable. GERD-stable. hypertension-well controlled. pain-on routine  hydrocodone. Depression-continue current antidepressant.  CPT CODE: 96045

## 2013-06-19 ENCOUNTER — Encounter: Payer: Self-pay | Admitting: Adult Health

## 2013-06-19 NOTE — Progress Notes (Signed)
Patient ID: Scott Caldwell, male   DOB: June 05, 1927, 79 y.o.   MRN: [id number]     [facility]  Allergies  Allergen Reactions  . Simvastatin     Chief Complaint  Patient presents with  . Medical Managment of Chronic Issues    HPI  He is being seen for the management of his chronic illnesses. His blood pressure has been running high and will require medication intervention. There are no other concerns being voiced by the nursing staff at this time. He is unable to fully participate in the hpi or ros.   Past Medical History  Diagnosis Date  . Coronary artery disease   . Hypertension   . Alzheimer's dementia   . GERD (gastroesophageal reflux disease)   . PTSD (post-traumatic stress disorder)   . Coronary atherosclerosis of native coronary artery   . Essential hypertension, benign   . Esophageal reflux   . Alzheimer's disease    No past surgical history on file.  Filed Vitals:   11/10/12 1349  BP: 158/74  Pulse: 80  Height: 5\' 10"  (1.778 m)  Weight: 187 lb (84.823 kg)    MEDICATIONS Asa 81 mg daily vicodin 5/325 mg daily biofreeze to knees twice daily Zantac 150 mg twice daily travatan z to both eyes nightly duonebs every 6 hours as needed  LABS REVIEWED:  08-04-12: wbc 4.5; hgb 12.7; hct 36.8 ;mcv 84; plt 158; vit b12: 1642; folate 13.9 10-20-12; glucose 87; bun 15; creat 0.80; k+4.1; na++ 142; liver normal albumin 3.7   Review of Systems  Unable to perform ROS   Physical Exam  Constitutional: He appears well-developed and well-nourished. No distress.  Neck: Neck supple. No JVD present. No thyromegaly present.  Cardiovascular: Normal rate, regular rhythm and intact distal pulses.   Respiratory: Effort normal and breath sounds normal. No respiratory distress. He has no wheezes.  GI: Soft. Bowel sounds are normal. He exhibits no distension. There is no tenderness.  Musculoskeletal: Normal range of motion. He exhibits no edema.  Neurological: He is alert.    Skin: Skin is warm and dry. He is not diaphoretic.     ASSESSMENT/PLAN  1. Hypertension; is worse: will continue asa 81 mg daily will begin lisinopril 5 mg daily; will have nursing check blood pressure twice daily for 2 weeks and report will check bmp in 2 weeks and will monitor  2. Osteoarthritis: will continue biofreeze to both knees twice daily; and vicodin 5/325 mg daily for pain   3. Gerd: will continue zantac 150 mg twice daily  4. Glaucoma: will continue to travatan to both eyes nightly

## 2013-06-27 ENCOUNTER — Non-Acute Institutional Stay (SKILLED_NURSING_FACILITY): Payer: PRIVATE HEALTH INSURANCE | Admitting: Internal Medicine

## 2013-06-27 ENCOUNTER — Encounter: Payer: Self-pay | Admitting: Internal Medicine

## 2013-06-27 DIAGNOSIS — I10 Essential (primary) hypertension: Secondary | ICD-10-CM

## 2013-06-27 DIAGNOSIS — K219 Gastro-esophageal reflux disease without esophagitis: Secondary | ICD-10-CM

## 2013-06-27 DIAGNOSIS — I2510 Atherosclerotic heart disease of native coronary artery without angina pectoris: Secondary | ICD-10-CM

## 2013-06-27 DIAGNOSIS — F0280 Dementia in other diseases classified elsewhere without behavioral disturbance: Secondary | ICD-10-CM

## 2013-06-27 NOTE — Progress Notes (Signed)
PROGRESS NOTE  DATE: 06-27-13  FACILITY: Nursing Home Location: [facility]  LEVEL OF CARE: SNF (31)  Routine Visit  CHIEF COMPLAINT:  Manage dementia, CAD, and GERD  HISTORY OF PRESENT ILLNESS:  REASSESSMENT OF ONGOING PROBLEM(S):  DEMENTIA: The dementia remaines stable and continues to function adequately in the current living environment with supervision.  The patient has had little changes in behavior. No complications noted from the medications presently being used. Patient does not follow commands.  CAD: The angina has been stable. The staff deny dyspnea on exertion, orthopnea, pedal edema, palpitations and paroxysmal nocturnal dyspnea. No complications noted from the medication presently being used.  GERD: pt's GERD is stable. staff Deny ongoing heartburn, abd. Pain, nausea or vomiting.  Currently on a PPI & tolerates it without any adverse reactions.  PAST MEDICAL HISTORY : Reviewed.  No changes.  CURRENT MEDICATIONS: Reviewed per [facility]  REVIEW OF SYSTEMS: Unobtainable due to dementia  PHYSICAL EXAMINATION  VS:  T 97.5      P 88      RR 20     BP 138/72    POX %     WT (Lb) 206  GENERAL: no acute distress, normal body habitus EYES: conjunctivae normal, sclerae normal, normal eye lids NECK: supple, trachea midline, no neck masses, no thyroid tenderness, no thyromegaly LYMPHATICS: no LAN in the neck, no supraclavicular LAN RESPIRATORY: breathing is even & unlabored, BS CTAB CARDIAC: RRR, no murmur,no extra heart sounds, no edema GI: abdomen soft, normal BS, no masses, no tenderness, no hepatomegaly, no splenomegaly PSYCHIATRIC: the patient is alert & disoriented, affect & behavior appropriate  LABS/RADIOLOGY:  9-14 CMP normal  7/14 cbc nl  5/14 BMP normal 4/14 CMP normal 1/14 CBC normal, B12 1642, folate 13.9  ASSESSMENT/PLAN:  Alzheimer's dementia-advanced. CAD-stable. GERD-stable. hypertension-well controlled. pain-on routine  hydrocodone. Depression-continue current antidepressant.  CPT CODE: 40981

## 2013-07-25 ENCOUNTER — Non-Acute Institutional Stay (SKILLED_NURSING_FACILITY): Payer: PRIVATE HEALTH INSURANCE | Admitting: Internal Medicine

## 2013-07-25 DIAGNOSIS — F0280 Dementia in other diseases classified elsewhere without behavioral disturbance: Secondary | ICD-10-CM

## 2013-07-25 DIAGNOSIS — I10 Essential (primary) hypertension: Secondary | ICD-10-CM

## 2013-07-25 DIAGNOSIS — G309 Alzheimer's disease, unspecified: Principal | ICD-10-CM

## 2013-07-25 DIAGNOSIS — I2510 Atherosclerotic heart disease of native coronary artery without angina pectoris: Secondary | ICD-10-CM

## 2013-07-25 DIAGNOSIS — K219 Gastro-esophageal reflux disease without esophagitis: Secondary | ICD-10-CM

## 2013-07-25 NOTE — Consult Note (Signed)
  This is an 80 y/o wd/wn b male who presents with hyperglycemia,traumatic brain injury, coronary artery disease, GERD, Lewy body dementia,Vit D deficiency and hand pain.  He is in need of multiple dental extractions and multiple quadrants of alveoplasty.  Because of his medical problems he is scheduled to be treated in the operating room under general anesthesia.  He is unable to be treated in the office.

## 2013-07-25 NOTE — H&P (Signed)
  H & P by Dr. Pearson GrippeJames Kim.  Patient cleared for surgery

## 2013-07-26 ENCOUNTER — Encounter (HOSPITAL_COMMUNITY): Payer: Self-pay | Admitting: *Deleted

## 2013-07-26 MED ORDER — CEFAZOLIN SODIUM-DEXTROSE 2-3 GM-% IV SOLR
2.0000 g | INTRAVENOUS | Status: AC
Start: 2013-07-27 — End: 2013-07-27
  Administered 2013-07-27: 2 g via INTRAVENOUS
  Filled 2013-07-26: qty 50

## 2013-07-26 NOTE — H&P (Signed)
Scott Caldwell is an 80 y.o. male.   Chief Complaint: unable to chew properly  HPI: long standing dental problems  Past Medical History  Diagnosis Date  . Coronary artery disease   . Hypertension   . Alzheimer's dementia   . GERD (gastroesophageal reflux disease)   . PTSD (post-traumatic stress disorder)   . Coronary atherosclerosis of native coronary artery 12/19/2012  . Essential hypertension, benign 12/19/2012  . Esophageal reflux 12/19/2012  . Alzheimer's disease 12/19/2012    No past surgical history on file.  No family history on file. Social History:  reports that he has never smoked. He does not have any smokeless tobacco history on file. His alcohol and drug histories are not on file.  Allergies:  Allergies  Allergen Reactions  . Simvastatin     No prescriptions prior to admission    No results found for this or any previous visit (from the past 48 hour(s)). No results found.  ROS  There were no vitals taken for this visit. Physical Exam  HENT:  Mouth/Throat: Uvula is midline, oropharynx is clear and moist and mucous membranes are normal. Dental caries present.       Assessment/Plan Multiple decayed and nonrestorable teeth  Larinda Herter,JOSEPH L 07/26/2013, 9:28 AM

## 2013-07-26 NOTE — Progress Notes (Signed)
Pre-op assessment was completed by both pt son Lavon Paganinihomas Jr. And Verl Bangsichy, [facility].

## 2013-07-27 ENCOUNTER — Encounter (HOSPITAL_COMMUNITY): Payer: Medicare Other | Admitting: Vascular Surgery

## 2013-07-27 ENCOUNTER — Ambulatory Visit (HOSPITAL_COMMUNITY): Payer: Medicare Other | Admitting: Vascular Surgery

## 2013-07-27 ENCOUNTER — Encounter (HOSPITAL_COMMUNITY)
Admission: RE | Disposition: A | Discharge status: Skilled Nursing Facility | Payer: Self-pay | Source: Ambulatory Visit | Attending: Oral Surgery

## 2013-07-27 ENCOUNTER — Ambulatory Visit (HOSPITAL_COMMUNITY): Payer: Medicare Other

## 2013-07-27 ENCOUNTER — Observation Stay (HOSPITAL_COMMUNITY)
Admission: RE | Admit: 2013-07-27 | Discharge: 2013-07-28 | Disposition: A | Discharge status: Skilled Nursing Facility | Payer: Medicare Other | Source: Ambulatory Visit | Attending: Oral Surgery | Admitting: Oral Surgery

## 2013-07-27 ENCOUNTER — Encounter (HOSPITAL_COMMUNITY): Payer: Self-pay | Admitting: *Deleted

## 2013-07-27 DIAGNOSIS — K0889 Other specified disorders of teeth and supporting structures: Principal | ICD-10-CM | POA: Insufficient documentation

## 2013-07-27 DIAGNOSIS — F0391 Unspecified dementia with behavioral disturbance: Secondary | ICD-10-CM

## 2013-07-27 DIAGNOSIS — K219 Gastro-esophageal reflux disease without esophagitis: Secondary | ICD-10-CM | POA: Insufficient documentation

## 2013-07-27 DIAGNOSIS — F430 Acute stress reaction: Secondary | ICD-10-CM | POA: Insufficient documentation

## 2013-07-27 DIAGNOSIS — F0280 Dementia in other diseases classified elsewhere without behavioral disturbance: Secondary | ICD-10-CM | POA: Insufficient documentation

## 2013-07-27 DIAGNOSIS — G309 Alzheimer's disease, unspecified: Secondary | ICD-10-CM | POA: Insufficient documentation

## 2013-07-27 DIAGNOSIS — E669 Obesity, unspecified: Secondary | ICD-10-CM | POA: Insufficient documentation

## 2013-07-27 DIAGNOSIS — Z9889 Other specified postprocedural states: Secondary | ICD-10-CM

## 2013-07-27 DIAGNOSIS — I2510 Atherosclerotic heart disease of native coronary artery without angina pectoris: Secondary | ICD-10-CM

## 2013-07-27 DIAGNOSIS — F03918 Unspecified dementia, unspecified severity, with other behavioral disturbance: Secondary | ICD-10-CM

## 2013-07-27 DIAGNOSIS — I10 Essential (primary) hypertension: Secondary | ICD-10-CM | POA: Insufficient documentation

## 2013-07-27 DIAGNOSIS — M170 Bilateral primary osteoarthritis of knee: Secondary | ICD-10-CM

## 2013-07-27 HISTORY — DX: Deficiency of other specified B group vitamins: E53.8

## 2013-07-27 HISTORY — PX: MULTIPLE EXTRACTIONS WITH ALVEOLOPLASTY: SHX5342

## 2013-07-27 HISTORY — DX: Hypotension, unspecified: I95.9

## 2013-07-27 HISTORY — DX: Major depressive disorder, single episode, unspecified: F32.9

## 2013-07-27 HISTORY — DX: Depression, unspecified: F32.A

## 2013-07-27 HISTORY — DX: Anemia, unspecified: D64.9

## 2013-07-27 HISTORY — DX: Unspecified glaucoma: H40.9

## 2013-07-27 LAB — BASIC METABOLIC PANEL
BUN: 17 mg/dL (ref 6–23)
CALCIUM: 9.3 mg/dL (ref 8.4–10.5)
CO2: 26 mEq/L (ref 19–32)
CREATININE: 0.98 mg/dL (ref 0.50–1.35)
Chloride: 102 mEq/L (ref 96–112)
GFR calc Af Amer: 85 mL/min — ABNORMAL LOW (ref >90)
GFR, EST NON AFRICAN AMERICAN: 73 mL/min — AB (ref >90)
GLUCOSE: 98 mg/dL (ref 70–99)
Potassium: 4.6 mEq/L (ref 3.7–5.3)
SODIUM: 140 meq/L (ref 137–147)

## 2013-07-27 LAB — CBC
HCT: 41.6 % (ref 39.0–52.0)
Hemoglobin: 13.7 g/dL (ref 13.0–17.0)
MCH: 28.4 pg (ref 26.0–34.0)
MCHC: 32.9 g/dL (ref 30.0–36.0)
MCV: 86.1 fL (ref 78.0–100.0)
PLATELETS: 166 10*3/uL (ref 150–400)
RBC: 4.83 MIL/uL (ref 4.22–5.81)
RDW: 13.9 % (ref 11.5–15.5)
WBC: 6.2 10*3/uL (ref 4.0–10.5)

## 2013-07-27 SURGERY — MULTIPLE EXTRACTION WITH ALVEOLOPLASTY
Anesthesia: General | Site: Mouth

## 2013-07-27 MED ORDER — DROPERIDOL 2.5 MG/ML IJ SOLN: 0.6250 mg | INTRAMUSCULAR | Status: DC | PRN

## 2013-07-27 MED ORDER — PHENYLEPHRINE HCL 10 MG/ML IJ SOLN
INTRAMUSCULAR | Status: DC | PRN
  Administered 2013-07-27 (×4): 80 ug via INTRAVENOUS

## 2013-07-27 MED ORDER — LACTATED RINGERS IV SOLN
INTRAVENOUS | Status: DC | PRN
  Administered 2013-07-27: 08:00:00 via INTRAVENOUS

## 2013-07-27 MED ORDER — BACIT-POLY-NEO HC 1 % EX OINT
TOPICAL_OINTMENT | CUTANEOUS | Status: AC
  Filled 2013-07-27: qty 15

## 2013-07-27 MED ORDER — SUCCINYLCHOLINE CHLORIDE 20 MG/ML IJ SOLN
INTRAMUSCULAR | Status: DC | PRN
  Administered 2013-07-27: 100 mg via INTRAVENOUS

## 2013-07-27 MED ORDER — SODIUM CHLORIDE 0.9 % IR SOLN
Status: DC | PRN
  Administered 2013-07-27: 1000 mL

## 2013-07-27 MED ORDER — FENTANYL CITRATE 0.05 MG/ML IJ SOLN
INTRAMUSCULAR | Status: DC | PRN
  Administered 2013-07-27: 100 ug via INTRAVENOUS

## 2013-07-27 MED ORDER — DEXAMETHASONE SODIUM PHOSPHATE 4 MG/ML IJ SOLN
INTRAMUSCULAR | Status: DC | PRN
  Administered 2013-07-27: 8 mg via INTRAVENOUS

## 2013-07-27 MED ORDER — LIDOCAINE-EPINEPHRINE 2 %-1:100000 IJ SOLN
INTRAMUSCULAR | Status: DC | PRN
  Administered 2013-07-27 (×4): 1.7 mL via INTRADERMAL

## 2013-07-27 MED ORDER — PROMETHAZINE HCL 25 MG RE SUPP: 25.0000 mg | Freq: Four times a day (QID) | RECTAL | Status: DC | PRN

## 2013-07-27 MED ORDER — SODIUM CHLORIDE 0.45 % IV SOLN
INTRAVENOUS | Status: DC
  Administered 2013-07-27: 14:00:00 via INTRAVENOUS

## 2013-07-27 MED ORDER — FENTANYL CITRATE 0.05 MG/ML IJ SOLN: 25.0000 ug | INTRAMUSCULAR | Status: DC | PRN

## 2013-07-27 MED ORDER — LIDOCAINE HCL (CARDIAC) 20 MG/ML IV SOLN
INTRAVENOUS | Status: DC | PRN
  Administered 2013-07-27: 40 mg via INTRAVENOUS

## 2013-07-27 MED ORDER — HYDROCODONE-ACETAMINOPHEN 5-325 MG PO TABS
1.0000 | ORAL_TABLET | ORAL | Status: DC | PRN
  Administered 2013-07-28: 1 via ORAL
  Filled 2013-07-27: qty 1

## 2013-07-27 MED ORDER — LIDOCAINE-EPINEPHRINE 2 %-1:100000 IJ SOLN
INTRAMUSCULAR | Status: AC
  Filled 2013-07-27: qty 13.6

## 2013-07-27 MED ORDER — OXYMETAZOLINE HCL 0.05 % NA SOLN
NASAL | Status: AC
  Filled 2013-07-27: qty 15

## 2013-07-27 MED ORDER — OXYMETAZOLINE HCL 0.05 % NA SOLN
NASAL | Status: DC | PRN
  Administered 2013-07-27: 1 via NASAL

## 2013-07-27 MED ORDER — ONDANSETRON HCL 4 MG/2ML IJ SOLN
INTRAMUSCULAR | Status: DC | PRN
  Administered 2013-07-27: 4 mg via INTRAVENOUS

## 2013-07-27 MED ORDER — CEFAZOLIN SODIUM 1-5 GM-% IV SOLN
1.0000 g | Freq: Three times a day (TID) | INTRAVENOUS | Status: DC
  Administered 2013-07-27 – 2013-07-28 (×3): 1 g via INTRAVENOUS
  Filled 2013-07-27 (×5): qty 50

## 2013-07-27 MED ORDER — PROMETHAZINE HCL 25 MG PO TABS: 25.0000 mg | ORAL_TABLET | Freq: Four times a day (QID) | ORAL | Status: DC | PRN

## 2013-07-27 MED ORDER — ISOPROPYL ALCOHOL 70 % SOLN
Status: DC | PRN
  Administered 2013-07-27: 1 via TOPICAL

## 2013-07-27 MED ORDER — BACITRACIN ZINC 500 UNIT/GM EX OINT
1.0000 "application " | TOPICAL_OINTMENT | Freq: Three times a day (TID) | CUTANEOUS | Status: DC
  Filled 2013-07-27: qty 28.35
  Filled 2013-07-27: qty 15

## 2013-07-27 MED ORDER — PROPOFOL 10 MG/ML IV BOLUS
INTRAVENOUS | Status: DC | PRN
  Administered 2013-07-27: 150 mg via INTRAVENOUS

## 2013-07-27 SURGICAL SUPPLY — 38 items
ALCOHOL 70% 16 OZ (MISCELLANEOUS) ×3 IMPLANT
BLADE SURG 15 STRL LF DISP TIS (BLADE) ×1 IMPLANT
BLADE SURG 15 STRL SS (BLADE) ×3
BUR RND FLUTED 2.5 (BURR) IMPLANT
BUR STRYKR 2.5 FLUT MED (BURR) IMPLANT
BUR SURG 4X8 MED (BURR) IMPLANT
BURR ×2 IMPLANT
BURR SURG 4MMX8MM MEDIUM (BURR)
BURR SURG 4X8 MED (BURR)
CANISTER SUCTION 2500CC (MISCELLANEOUS) ×3 IMPLANT
CLOTH BEACON ORANGE TIMEOUT ST (SAFETY) ×1 IMPLANT
COVER SURGICAL LIGHT HANDLE (MISCELLANEOUS) ×3 IMPLANT
GAUZE PACKING FOLDED 2  STR (GAUZE/BANDAGES/DRESSINGS) ×2
GAUZE PACKING FOLDED 2 STR (GAUZE/BANDAGES/DRESSINGS) ×1 IMPLANT
GAUZE SPONGE 4X4 16PLY XRAY LF (GAUZE/BANDAGES/DRESSINGS) ×3 IMPLANT
GLOVE BIO SURGEON STRL SZ 6.5 (GLOVE) ×3 IMPLANT
GLOVE BIO SURGEON STRL SZ7.5 (GLOVE) ×4 IMPLANT
GLOVE BIO SURGEONS STRL SZ 6.5 (GLOVE) ×2
GOWN PREVENTION PLUS XLARGE (GOWN DISPOSABLE) ×3 IMPLANT
GOWN STRL NON-REIN LRG LVL3 (GOWN DISPOSABLE) ×8 IMPLANT
KIT BASIN OR (CUSTOM PROCEDURE TRAY) ×3 IMPLANT
KIT ROOM TURNOVER OR (KITS) ×3 IMPLANT
NEEDLE BLUNT 16X1.5 OR ONLY (NEEDLE) ×6 IMPLANT
NEEDLE DENTAL 27 LONG (NEEDLE) ×6 IMPLANT
NS IRRIG 1000ML POUR BTL (IV SOLUTION) ×3 IMPLANT
PACK EENT II TURBAN DRAPE (CUSTOM PROCEDURE TRAY) ×3 IMPLANT
PAD ARMBOARD 7.5X6 YLW CONV (MISCELLANEOUS) ×6 IMPLANT
SPONGE GAUZE 4X4 12PLY (GAUZE/BANDAGES/DRESSINGS) IMPLANT
SPONGE GAUZE 4X4 12PLY STER LF (GAUZE/BANDAGES/DRESSINGS) ×2 IMPLANT
SPONGE SURGIFOAM ABS GEL 12-7 (HEMOSTASIS) IMPLANT
SUT CHROMIC 3 0 PS 2 (SUTURE) ×6 IMPLANT
SYR 50ML SLIP (SYRINGE) ×6 IMPLANT
TUBE CONNECTING 12'X1/4 (SUCTIONS) ×1
TUBE CONNECTING 12X1/4 (SUCTIONS) ×2 IMPLANT
TUBING IRRIGATION (MISCELLANEOUS) IMPLANT
VENT IRR SPI W TUB AD (MISCELLANEOUS) ×3 IMPLANT
WATER STERILE IRR 1000ML POUR (IV SOLUTION) IMPLANT
YANKAUER SUCT BULB TIP NO VENT (SUCTIONS) ×3 IMPLANT

## 2013-07-27 NOTE — Anesthesia Procedure Notes (Signed)
Procedure Name: Intubation Date/Time: 07/27/2013 8:13 AM Performed by: Sharlene DoryWALKER, Scott Yanko E Pre-anesthesia Checklist: Patient identified, Suction available, Emergency Drugs available, Patient being monitored and Timeout performed Patient Re-evaluated:Patient Re-evaluated prior to inductionOxygen Delivery Method: Circle system utilized Preoxygenation: Pre-oxygenation with 100% oxygen Intubation Type: IV induction and Rapid sequence Laryngoscope Size: Mac and 4 Grade View: Grade I Nasal Tubes: Right, Nasal prep performed, Nasal Rae and Magill forceps- large, utilized Tube size: 7.0 mm Number of attempts: 1 Placement Confirmation: ETT inserted through vocal cords under direct vision,  positive ETCO2 and breath sounds checked- equal and bilateral Tube secured with: Tape Dental Injury: Teeth and Oropharynx as per pre-operative assessment

## 2013-07-27 NOTE — H&P (Signed)
  The patient is medically stable. The treatment plan is to keep him overnite.  There is no change in his medical status.

## 2013-07-27 NOTE — Op Note (Signed)
This is an 80 year old well-developed well-nourished black male was brought to the operating room in a supine position and which remained throughout the whole procedure. He was intubated via right nasoendotracheal tube. He was prepped and draped in usual fashion for an intraoral procedure. The mouth was rinsed out and suctioned. A throat pack was placed around the endotracheal tube. 3 cc of 2% Xylocaine with 1-100,000 epinephrine was given as bilateral blocks and infiltrations of the anterior and posterior mandible. With a tongue retractor in place a #15 blade made an incision extending from #17 to #22. A full-thickness buccal flap was elevated with periosteal elevator. Each tooth was then removed with a lower universal forceps. Extensive granulation tissue was excised with a 15 blade and a rongeur. The bone was then trimmed with a rongeur and a round bur and copious irrigation. Once the areas were all smooth the sockets were curetted and irrigated. The soft tissue was closed primarily with multiple 3-0 chromic sutures. A #15 blade then made an incision extending from #22 to #32. A full-thickness mucoperiosteal flap was elevated with a periosteal elevator. Each tooth was removed with a lower universal forceps. The sockets were curetted and irrigated. The bone was trimmed with a rongeur and a round bur and copious irrigation. All areas were checked to make sure there were no sharp edges. It was smoothed off with a bone file. The area was again copiously irrigated and closed with multiple 3-0 chromic sutures. The mouth was copiously irrigated. The throat pack was removed. The patient tolerated the procedure well and was extubated on the table. He was then returned to the recovery room in good condition. He will be kept overnight and a 23 hour observation bed.

## 2013-07-27 NOTE — Anesthesia Postprocedure Evaluation (Signed)
  Anesthesia Post-op Note  Patient: Scott Caldwell  Procedure(s) Performed: Procedure(s): MULTIPLE SURGICAL EXTRACTIONS WITH SUB-QUADRATIC ALVEOLOPLASTY (N/A)  Patient Location: PACU  Anesthesia Type:General  Level of Consciousness: sedated, pateint uncooperative and responds to stimulation  Airway and Oxygen Therapy: Patient Spontanous Breathing  Post-op Pain: none  Post-op Assessment: Post-op Vital signs reviewed, Patient's Cardiovascular Status Stable, Respiratory Function Stable, Patent Airway, No signs of Nausea or vomiting and Pain level controlled  Post-op Vital Signs: Reviewed and stable  Complications: No apparent anesthesia complications

## 2013-07-27 NOTE — Preoperative (Signed)
Beta Blockers   Reason not to administer Beta Blockers:Not Applicable 

## 2013-07-27 NOTE — Transfer of Care (Signed)
Immediate Anesthesia Transfer of Care Note  Patient: Scott Caldwell  Procedure(s) Performed: Procedure(s): MULTIPLE SURGICAL EXTRACTIONS WITH SUB-QUADRATIC ALVEOLOPLASTY (N/A)  Patient Location: PACU  Anesthesia Type:General  Level of Consciousness: sedated  Airway & Oxygen Therapy: Patient Spontanous Breathing and Patient connected to face mask oxygen  Post-op Assessment: Report given to PACU RN and Post -op Vital signs reviewed and stable  Post vital signs: Reviewed and stable  Complications: No apparent anesthesia complications

## 2013-07-27 NOTE — Brief Op Note (Signed)
07/27/2013  9:17 AM  PATIENT:  Scott Caldwell  80 y.o. male  PRE-OPERATIVE DIAGNOSIS:  MULTI NON RESTORABLE TEETH   POST-OPERATIVE DIAGNOSIS:  MULTI NON RESTORABLE TEETH  PROCEDURE:  Procedure(s): MULTIPLE SURGICAL EXTRACTIONS WITH SUB-QUADRATIC ALVEOLOPLASTY (N/A)  SURGEON:  Surgeon(s) and Role:    * Hinton DyerJoseph L Shalaya Swailes, DDS - Primary  PHYSICIAN ASSISTANT:   ASSISTANTS: Montel Culveronna Dixon and Windy KalataMorgan Arledge   ANESTHESIA:   general  EBL:     BLOOD ADMINISTERED:none  DRAINS: none   LOCAL MEDICATIONS USED:  XYLOCAINE   SPECIMEN:  No Specimen and Excision  DISPOSITION OF SPECIMEN:  N/A  COUNTS:  YES  TOURNIQUET:  * No tourniquets in log *  DICTATION: .Dragon Dictation  PLAN OF CARE: Admit for overnight observation  PATIENT DISPOSITION:  PACU - hemodynamically stable.   Delay start of Pharmacological VTE agent (>24hrs) due to surgical blood loss or risk of bleeding:n/a

## 2013-07-27 NOTE — Anesthesia Preprocedure Evaluation (Addendum)
Anesthesia Evaluation  Patient identified by MRN, date of birth, ID band Patient awake    Reviewed: Allergy & Precautions, H&P , NPO status , Patient's Chart, lab work & pertinent test results  History of Anesthesia Complications Negative for: history of anesthetic complications  Airway Mallampati: III  Neck ROM: full  Mouth opening: Limited Mouth Opening  Dental   Pulmonary neg pulmonary ROS,  breath sounds clear to auscultation  Pulmonary exam normal       Cardiovascular hypertension, Pt. on medications + CAD (no prior heart studies, does not c/o chest pain) Rhythm:Regular Rate:Normal     Neuro/Psych Depression PTSD.Alzheimer's dementia.    GI/Hepatic Neg liver ROS, GERD-  Medicated and Controlled,  Endo/Other  negative endocrine ROS  Renal/GU negative Renal ROS     Musculoskeletal   Abdominal (+) - obese,   Peds  Hematology   Anesthesia Other Findings Uncooperative, incommunicative  Reproductive/Obstetrics                        Anesthesia Physical Anesthesia Plan  ASA: III  Anesthesia Plan: General   Post-op Pain Management:    Induction: Intravenous  Airway Management Planned: Nasal ETT  Additional Equipment:   Intra-op Plan:   Post-operative Plan: Extubation in OR  Informed Consent: I have reviewed the patients History and Physical, chart, labs and discussed the procedure including the risks, benefits and alternatives for the proposed anesthesia with the patient or authorized representative who has indicated his/her understanding and acceptance.     Plan Discussed with: CRNA, Anesthesiologist and Surgeon  Anesthesia Plan Comments: (Plan routine monitors, GETA)       Anesthesia Quick Evaluation

## 2013-07-27 NOTE — Consult Note (Signed)
Patient is stable and resting comfortably.   Will discharge in AM and return patient back to nursing home. No bleeding noted.

## 2013-07-28 NOTE — Progress Notes (Signed)
Patient resting comfortably at this point.  No signs of distress noted.

## 2013-07-28 NOTE — Progress Notes (Signed)
CSW spoke with nurse about Pt d/c planning.  Pt's nurse stated that [facility] was [facility]    CSW then contacted [facility] Marylene Land(Angela) for d/c planning. Marylene Landngela stated that Pt can return to facility and that they would only need a d/c summary and AVS sent at time of d/c. Marylene Landngela also stated that it would be fine for the Pt to return either by facility Zenaida Niecevan or Pt son. CSW will inform Pt nurse and assist with faxing d/c summary/AVS to facility.   Pt ok to return to facility.   CSW will follow for d/c planning.     Scott Caldwell [facility]  [facility]  [id number];  [phone] Phone: [phone]

## 2013-07-28 NOTE — Progress Notes (Signed)
         PROGRESS NOTE  DATE: 07-25-13  FACILITY: Nursing Home [facility]  LEVEL OF CARE: SNF (31)  Routine Visit  CHIEF COMPLAINT:  Manage dementia, CAD, and GERD  HISTORY OF PRESENT ILLNESS:  REASSESSMENT OF ONGOING PROBLEM(S):  DEMENTIA: The dementia remaines stable and continues to function adequately in the current living environment with supervision.  The patient has had little changes in behavior. No complications noted from the medications presently being used. Patient does not follow commands.  CAD: The angina has been stable. The staff deny dyspnea on exertion, orthopnea, pedal edema, palpitations and paroxysmal nocturnal dyspnea. No complications noted from the medication presently being used.  GERD: pt's GERD is stable. staff Deny ongoing heartburn, abd. Pain, nausea or vomiting.  Currently on a PPI & tolerates it without any adverse reactions.  PAST MEDICAL HISTORY : Reviewed.  No changes.  CURRENT MEDICATIONS: [facility]  REVIEW OF SYSTEMS: Unobtainable due to dementia  PHYSICAL EXAMINATION  VS:  T 96.1      P 64     RR 14    BP 124/78      WT (Lb) 200  GENERAL: no acute distress, normal body habitus EYES: conjunctivae normal, sclerae normal, normal eye lids NECK: supple, trachea midline, no neck masses, no thyroid tenderness, no thyromegaly LYMPHATICS: no LAN in the neck, no supraclavicular LAN RESPIRATORY: breathing is even & unlabored, BS CTAB CARDIAC: RRR, no murmur,no extra heart sounds, no edema GI: abdomen soft, normal BS, no masses, no tenderness, no hepatomegaly, no splenomegaly PSYCHIATRIC: the patient is alert & disoriented, affect & behavior appropriate  LABS/RADIOLOGY:  9-14 CMP normal  7/14 cbc nl  5/14 BMP normal 4/14 CMP normal 1/14 CBC normal, B12 1642, folate 13.9  ASSESSMENT/PLAN:  Alzheimer's dementia-advanced. CAD-stable. GERD-stable. hypertension-well controlled. pain-on routine  hydrocodone. Depression-continue current antidepressant. Check CBC  CPT CODE: [id number]

## 2013-07-31 ENCOUNTER — Encounter (HOSPITAL_COMMUNITY): Payer: Self-pay | Admitting: Oral Surgery

## 2013-08-03 ENCOUNTER — Non-Acute Institutional Stay (SKILLED_NURSING_FACILITY): Payer: PRIVATE HEALTH INSURANCE | Admitting: Internal Medicine

## 2013-08-03 DIAGNOSIS — R059 Cough, unspecified: Secondary | ICD-10-CM

## 2013-08-03 DIAGNOSIS — R05 Cough: Secondary | ICD-10-CM

## 2013-08-05 DIAGNOSIS — R05 Cough: Secondary | ICD-10-CM | POA: Insufficient documentation

## 2013-08-05 DIAGNOSIS — R059 Cough, unspecified: Secondary | ICD-10-CM | POA: Insufficient documentation

## 2013-08-05 NOTE — Progress Notes (Signed)
         PROGRESS NOTE  DATE: 08/03/2013  [facility]  LEVEL OF CARE: SNF (31)  Acute Visit  CHIEF COMPLAINT:  Manage cough  HISTORY OF PRESENT ILLNESS: I was requested by the staff to assess the patient regarding above problem(s):  COUGH: new Problem. Staff reports a new onset cough for several days. Cough is (non)productive. Precipitating or alleviating factors cannot be identified. There are no other associated signs and symptoms such as shortness of breath, chills or night sweats. There is no respiratory insufficiency. There is no temporal relationship. Patient has a low-grade fever. Patient does not follow commands due to advanced dementia.  PAST MEDICAL HISTORY : Reviewed.  No changes.  CURRENT MEDICATIONS: [facility]ed [facility]  REVIEW OF SYSTEMS: Unobtainable due to dementia  PHYSICAL EXAMINATION  VS:  T 99.5        P 98       RR 14      BP 120/78        GENERAL: no acute distress, normal body habitus NECK: supple, trachea midline, no neck masses, no thyroid tenderness, no thyromegaly LYMPHATICS: no LAN in the neck, no supraclavicular LAN RESPIRATORY: breathing is even & unlabored, BS diminished CARDIAC: RRR, no murmur,no extra heart sounds, no edema   ASSESSMENT/PLAN:  Cough and fever-new problem. Will obtain chest x-ray. Start Mucinex 600 mg twice a day for one week.  CPT CODE: [id number]

## 2013-08-11 NOTE — Discharge Summary (Signed)
  The patient is being discharge to the nursing home in good condition.  There was no bleeding and no acute problems.

## 2013-10-02 ENCOUNTER — Other Ambulatory Visit: Payer: Self-pay | Admitting: *Deleted

## 2013-10-02 MED ORDER — HYDROCODONE-ACETAMINOPHEN 5-325 MG PO TABS: ORAL_TABLET | ORAL | Status: DC

## 2013-10-02 NOTE — Telephone Encounter (Signed)
[facility] 

## 2013-11-06 ENCOUNTER — Other Ambulatory Visit: Payer: Self-pay | Admitting: *Deleted

## 2013-11-06 MED ORDER — HYDROCODONE-ACETAMINOPHEN 5-325 MG PO TABS: ORAL_TABLET | ORAL | Status: DC

## 2013-11-06 NOTE — Telephone Encounter (Signed)
[facility] 

## 2013-11-27 ENCOUNTER — Non-Acute Institutional Stay (SKILLED_NURSING_FACILITY): Payer: PRIVATE HEALTH INSURANCE | Admitting: Internal Medicine

## 2013-11-27 DIAGNOSIS — G309 Alzheimer's disease, unspecified: Principal | ICD-10-CM

## 2013-11-27 DIAGNOSIS — I10 Essential (primary) hypertension: Secondary | ICD-10-CM

## 2013-11-27 DIAGNOSIS — I2510 Atherosclerotic heart disease of native coronary artery without angina pectoris: Secondary | ICD-10-CM

## 2013-11-27 DIAGNOSIS — K219 Gastro-esophageal reflux disease without esophagitis: Secondary | ICD-10-CM

## 2013-11-27 DIAGNOSIS — F0280 Dementia in other diseases classified elsewhere without behavioral disturbance: Secondary | ICD-10-CM

## 2013-11-27 NOTE — Progress Notes (Signed)
          PROGRESS NOTE  DATE: 11-27-13  FACILITY: [facility]  LEVEL OF CARE: SNF (31)  Routine Visit  CHIEF COMPLAINT:  Manage dementia, CAD, and GERD  HISTORY OF PRESENT ILLNESS:  REASSESSMENT OF ONGOING PROBLEM(S):  DEMENTIA: The dementia remaines stable and continues to function adequately in the current living environment with supervision.  The patient has had little changes in behavior. No complications noted from the medications presently being used. Patient does not follow commands.  CAD: The angina has been stable. The staff deny dyspnea on exertion, orthopnea, pedal edema, palpitations and paroxysmal nocturnal dyspnea. No complications noted from the medication presently being used.  GERD: pt's GERD is stable. staff Deny ongoing heartburn, abd. Pain, nausea or vomiting.  Currently on a PPI & tolerates it without any adverse reactions.  PAST MEDICAL HISTORY : Reviewed.  No changes.  CURRENT MEDICATIONS: [facility]  REVIEW OF SYSTEMS: Unobtainable due to dementia  PHYSICAL EXAMINATION  VS:  See vital signs section  GENERAL: no acute distress, normal body habitus EYES: conjunctivae normal, sclerae normal, normal eye lids NECK: supple, trachea midline, no neck masses, no thyroid tenderness, no thyromegaly LYMPHATICS: no LAN in the neck, no supraclavicular LAN RESPIRATORY: breathing is even & unlabored, BS CTAB CARDIAC: RRR, no murmur,no extra heart sounds, no edema GI: abdomen soft, normal BS, no masses, no tenderness, no hepatomegaly, no splenomegaly PSYCHIATRIC: the patient is alert & disoriented, affect & behavior appropriate  LABS/RADIOLOGY: 4-15 CMP normal 3-15 vitamin D level 43.6 1-15 CBC normal 9-14 CMP normal  7/14 cbc nl  5/14 BMP normal 4/14 CMP normal 1/14 CBC normal, B12 1642, folate 13.9  ASSESSMENT/PLAN:  Alzheimer's dementia-advanced. CAD-stable. GERD-stable. hypertension-well  controlled. pain-on routine hydrocodone. Depression-continue current antidepressant. Diabetes mellitus-metformin was started. Check hemoglobin A1c  CPT CODE: 1610999309  Newton PiggGayani Y. Kerry Doryasanayaka, MD [facility] [phone]

## 2013-12-04 ENCOUNTER — Other Ambulatory Visit: Payer: Self-pay | Admitting: *Deleted

## 2013-12-04 MED ORDER — HYDROCODONE-ACETAMINOPHEN 5-325 MG PO TABS: ORAL_TABLET | ORAL | Status: DC

## 2013-12-04 NOTE — Telephone Encounter (Signed)
[facility] 

## 2013-12-18 ENCOUNTER — Non-Acute Institutional Stay (SKILLED_NURSING_FACILITY): Payer: PRIVATE HEALTH INSURANCE | Admitting: Internal Medicine

## 2013-12-18 DIAGNOSIS — K219 Gastro-esophageal reflux disease without esophagitis: Secondary | ICD-10-CM

## 2013-12-18 DIAGNOSIS — F0280 Dementia in other diseases classified elsewhere without behavioral disturbance: Secondary | ICD-10-CM

## 2013-12-18 DIAGNOSIS — I2510 Atherosclerotic heart disease of native coronary artery without angina pectoris: Secondary | ICD-10-CM

## 2013-12-18 DIAGNOSIS — I10 Essential (primary) hypertension: Secondary | ICD-10-CM

## 2013-12-18 DIAGNOSIS — G309 Alzheimer's disease, unspecified: Principal | ICD-10-CM

## 2013-12-18 NOTE — Progress Notes (Signed)
           PROGRESS NOTE  DATE: 12-18-13  FACILITY: Nursing Home [facility]  LEVEL OF CARE: SNF (31)  Routine Visit  CHIEF COMPLAINT:  Manage dementia, CAD, and GERD  HISTORY OF PRESENT ILLNESS:  REASSESSMENT OF ONGOING PROBLEM(S):  DEMENTIA: The dementia remaines stable and continues to function adequately in the current living environment with supervision.  The patient has had little changes in behavior. No complications noted from the medications presently being used. Patient does not follow commands.  CAD: The angina has been stable. The staff deny dyspnea on exertion, orthopnea, pedal edema, palpitations and paroxysmal nocturnal dyspnea. No complications noted from the medication presently being used.  GERD: pt's GERD is stable. staff Deny ongoing heartburn, abd. Pain, nausea or vomiting.  Currently on a PPI & tolerates it without any adverse reactions.  PAST MEDICAL HISTORY : Reviewed.  No changes.  CURRENT MEDICATIONS: Reviewed per [facility]  REVIEW OF SYSTEMS: Unobtainable due to dementia  PHYSICAL EXAMINATION  VS:  See vital signs section  GENERAL: no acute distress, normal body habitus EYES: conjunctivae normal, sclerae normal, normal eye lids NECK: supple, trachea midline, no neck masses, no thyroid tenderness, no thyromegaly LYMPHATICS: no LAN in the neck, no supraclavicular LAN RESPIRATORY: breathing is even & unlabored, BS CTAB CARDIAC: RRR, no murmur,no extra heart sounds, no edema GI: abdomen soft, normal BS, no masses, no tenderness, no hepatomegaly, no splenomegaly PSYCHIATRIC: the patient is alert & disoriented, affect & behavior appropriate  LABS/RADIOLOGY: 4-15 CMP normal 3-15 vitamin D level 43.6 1-15 CBC normal 9-14 CMP normal  7/14 cbc nl  5/14 BMP normal 4/14 CMP normal 1/14 CBC normal, B12 1642, folate 13.9  ASSESSMENT/PLAN:  Alzheimer's dementia-advanced. CAD-stable. GERD-stable. hypertension-well  controlled. pain-on routine hydrocodone. Depression-continue current antidepressant. Diabetes mellitus- Check hemoglobin A1c  CPT CODE: 78242  Newton Pigg. Kerry Dory, MD [facility] [phone]

## 2014-01-08 ENCOUNTER — Other Ambulatory Visit: Payer: Self-pay | Admitting: *Deleted

## 2014-01-08 MED ORDER — HYDROCODONE-ACETAMINOPHEN 5-325 MG PO TABS: ORAL_TABLET | ORAL | Status: DC

## 2014-01-08 NOTE — Telephone Encounter (Signed)
[facility] 

## 2014-01-22 ENCOUNTER — Non-Acute Institutional Stay (SKILLED_NURSING_FACILITY): Payer: PRIVATE HEALTH INSURANCE | Admitting: Internal Medicine

## 2014-01-22 DIAGNOSIS — G309 Alzheimer's disease, unspecified: Principal | ICD-10-CM

## 2014-01-22 DIAGNOSIS — I10 Essential (primary) hypertension: Secondary | ICD-10-CM

## 2014-01-22 DIAGNOSIS — I2510 Atherosclerotic heart disease of native coronary artery without angina pectoris: Secondary | ICD-10-CM

## 2014-01-22 DIAGNOSIS — K219 Gastro-esophageal reflux disease without esophagitis: Secondary | ICD-10-CM

## 2014-01-22 DIAGNOSIS — F0280 Dementia in other diseases classified elsewhere without behavioral disturbance: Secondary | ICD-10-CM

## 2014-01-24 NOTE — Progress Notes (Signed)
         PROGRESS NOTE  DATE: 01-22-14  FACILITY: [facility]  LEVEL OF CARE: SNF (31)  Routine Visit  CHIEF COMPLAINT:  Manage dementia, CAD, and GERD  HISTORY OF PRESENT ILLNESS:  REASSESSMENT OF ONGOING PROBLEM(S):  DEMENTIA: The dementia remaines stable and continues to function adequately in the current living environment with supervision.  The patient has had little changes in behavior. No complications noted from the medications presently being used. Patient does not follow commands.  CAD: The angina has been stable. The staff deny dyspnea on exertion, orthopnea, pedal edema, palpitations and paroxysmal nocturnal dyspnea. No complications noted from the medication presently being used.  GERD: pt's GERD is stable. staff Deny ongoing heartburn, abd. Pain, nausea or vomiting.  Currently on a PPI & tolerates it without any adverse reactions.  PAST MEDICAL HISTORY : Reviewed.  No changes.  [facility]  REVIEW OF SYSTEMS: Unobtainable due to dementia  PHYSICAL EXAMINATION  VS:  See vital signs section  GENERAL: no acute distress, normal body habitus EYES: conjunctivae normal, sclerae normal, normal eye lids NECK: supple, trachea midline, no neck masses, no thyroid tenderness, no thyromegaly LYMPHATICS: no LAN in the neck, no supraclavicular LAN RESPIRATORY: breathing is even & unlabored, BS CTAB CARDIAC: RRR, no murmur,no extra heart sounds, no edema GI: abdomen soft, normal BS, no masses, no tenderness, no hepatomegaly, no splenomegaly PSYCHIATRIC: the patient is alert & disoriented, affect & behavior appropriate  LABS/RADIOLOGY: 5-15 hemoglobin A1c 6.4 4-15 CMP normal 3-15 vitamin D level 43.6 1-15 CBC normal 9-14 CMP normal  7/14 cbc nl  5/14 BMP normal 4/14 CMP normal 1/14 CBC normal, B12 1642, folate 13.9  ASSESSMENT/PLAN:  Alzheimer's  dementia-advanced. CAD-stable. GERD-stable. hypertension-well controlled. pain-on routine hydrocodone. Depression-continue current antidepressant. Diabetes mellitus- well controlled Check CBC  CPT CODE: [id number]  Newton PiggGayani Y. Kerry Doryasanayaka, MD [facility] [phone]

## 2014-02-05 ENCOUNTER — Other Ambulatory Visit: Payer: Self-pay | Admitting: *Deleted

## 2014-02-05 MED ORDER — HYDROCODONE-ACETAMINOPHEN 5-325 MG PO TABS: ORAL_TABLET | ORAL | Status: DC

## 2014-02-05 NOTE — Telephone Encounter (Signed)
[facility] 

## 2014-02-22 ENCOUNTER — Non-Acute Institutional Stay (SKILLED_NURSING_FACILITY): Payer: PRIVATE HEALTH INSURANCE | Admitting: Internal Medicine

## 2014-02-22 DIAGNOSIS — F0280 Dementia in other diseases classified elsewhere without behavioral disturbance: Secondary | ICD-10-CM

## 2014-02-22 DIAGNOSIS — K219 Gastro-esophageal reflux disease without esophagitis: Secondary | ICD-10-CM

## 2014-02-22 DIAGNOSIS — I2510 Atherosclerotic heart disease of native coronary artery without angina pectoris: Secondary | ICD-10-CM

## 2014-02-22 DIAGNOSIS — I10 Essential (primary) hypertension: Secondary | ICD-10-CM

## 2014-02-22 DIAGNOSIS — G309 Alzheimer's disease, unspecified: Principal | ICD-10-CM

## 2014-02-24 NOTE — Progress Notes (Signed)
         PROGRESS NOTE  DATE: 02-22-14  FACILITY: [facility] Home [facility]  LEVEL OF CARE: SNF (31)  Routine Visit  CHIEF COMPLAINT:  Manage dementia, CAD, and GERD  HISTORY OF PRESENT ILLNESS:  REASSESSMENT OF ONGOING PROBLEM(S):  DEMENTIA: The dementia remaines stable and continues to function adequately in the current living environment with supervision.  The patient has had little changes in behavior. No complications noted from the medications presently being used. Patient does not follow commands.  CAD: The angina has been stable. The staff deny dyspnea on exertion, orthopnea, pedal edema, palpitations and paroxysmal nocturnal dyspnea. No complications noted from the medication presently being used.  GERD: pt's GERD is stable. staff Deny ongoing heartburn, abd. Pain, nausea or vomiting.  Currently on a PPI & tolerates it without any adverse reactions.  PAST MEDICAL HISTORY : Reviewed.  No changes.  CURRENT MEDICATIONS: [facility]  REVIEW OF SYSTEMS: Unobtainable due to dementia  PHYSICAL EXAMINATION  VS:  See vital signs section  GENERAL: no acute distress, normal body habitus EYES: conjunctivae normal, sclerae normal, normal eye lids NECK: supple, trachea midline, no neck masses, no thyroid tenderness, no thyromegaly LYMPHATICS: no LAN in the neck, no supraclavicular LAN RESPIRATORY: breathing is even & unlabored, BS CTAB CARDIAC: RRR, no murmur,no extra heart sounds, no edema GI: abdomen soft, normal BS, no masses, no tenderness, no hepatomegaly, no splenomegaly PSYCHIATRIC: the patient is alert & disoriented, affect & behavior appropriate  LABS/RADIOLOGY: 7-15 CBC normal 5-15 hemoglobin A1c 6.4 4-15 CMP normal 3-15 vitamin D level 43.6 1-15 CBC normal 9-14 CMP normal  7/14 cbc nl  5/14 BMP normal 4/14 CMP normal 1/14 CBC normal, B12 1642, folate 13.9  ASSESSMENT/PLAN:  Alzheimer's  dementia-advanced. CAD-stable. GERD-stable. hypertension-last blood pressure a elevated. We'll review a log. pain-on routine hydrocodone. Depression-continue current antidepressant. Diabetes mellitus- well controlled  CPT CODE: [id number]  Newton PiggGayani Y. Kerry Doryasanayaka, MD [facility] [phone]

## 2014-03-01 ENCOUNTER — Other Ambulatory Visit: Payer: Self-pay | Admitting: *Deleted

## 2014-03-01 MED ORDER — HYDROCODONE-ACETAMINOPHEN 5-325 MG PO TABS: ORAL_TABLET | ORAL | Status: DC

## 2014-03-01 NOTE — Telephone Encounter (Signed)
[facility] 

## 2014-03-21 ENCOUNTER — Non-Acute Institutional Stay (SKILLED_NURSING_FACILITY): Payer: PRIVATE HEALTH INSURANCE | Admitting: Internal Medicine

## 2014-03-21 DIAGNOSIS — I2510 Atherosclerotic heart disease of native coronary artery without angina pectoris: Secondary | ICD-10-CM

## 2014-03-21 DIAGNOSIS — G309 Alzheimer's disease, unspecified: Principal | ICD-10-CM

## 2014-03-21 DIAGNOSIS — F0280 Dementia in other diseases classified elsewhere without behavioral disturbance: Secondary | ICD-10-CM

## 2014-03-21 DIAGNOSIS — I10 Essential (primary) hypertension: Secondary | ICD-10-CM

## 2014-03-21 DIAGNOSIS — K219 Gastro-esophageal reflux disease without esophagitis: Secondary | ICD-10-CM

## 2014-03-23 NOTE — Progress Notes (Signed)
         PROGRESS NOTE  DATE: 03-21-14  FACILITY: [facility] Home [facility]  LEVEL OF CARE: SNF (31)  Routine Visit  CHIEF COMPLAINT:  Manage dementia, CAD, and GERD  HISTORY OF PRESENT ILLNESS:  REASSESSMENT OF ONGOING PROBLEM(S):  DEMENTIA: The dementia remaines stable and continues to function adequately in the current living environment with supervision.  The patient has had little changes in behavior. No complications noted from the medications presently being used. Patient does not follow commands.  CAD: The angina has been stable. The staff deny dyspnea on exertion, orthopnea, pedal edema, palpitations and paroxysmal nocturnal dyspnea. No complications noted from the medication presently being used.  GERD: pt's GERD is stable. staff Deny ongoing heartburn, abd. Pain, nausea or vomiting.  Currently on a PPI & tolerates it without any adverse reactions.  PAST MEDICAL HISTORY : Reviewed.  No changes.  CURRENT MEDICATIONS: Reviewed per [facility]  REVIEW OF SYSTEMS: Unobtainable due to dementia  PHYSICAL EXAMINATION  VS:  See vital signs section  GENERAL: no acute distress, normal body habitus EYES: conjunctivae normal, sclerae normal, normal eye lids NECK: supple, trachea midline, no neck masses, no thyroid tenderness, no thyromegaly LYMPHATICS: no LAN in the neck, no supraclavicular LAN RESPIRATORY: breathing is even & unlabored, BS CTAB CARDIAC: RRR, no murmur,no extra heart sounds, no edema GI: abdomen soft, normal BS, no masses, no tenderness, no hepatomegaly, no splenomegaly PSYCHIATRIC: the patient is alert & disoriented, affect & behavior appropriate  LABS/RADIOLOGY: 7-15 CBC normal 5-15 hemoglobin A1c 6.4 4-15 CMP normal 3-15 vitamin D level 43.6 1-15 CBC normal 9-14 CMP normal  7/14 cbc nl  5/14 BMP normal 4/14 CMP normal 1/14 CBC normal, B12 1642, folate 13.9  ASSESSMENT/PLAN:  Alzheimer's  dementia-advanced. CAD-stable. GERD-stable. hypertension-well controlled pain-on routine hydrocodone. Depression-continue current antidepressant. Diabetes mellitus- well controlled  CPT CODE: 16109  Newton Pigg. Kerry Dory, MD [facility] [phone]

## 2014-04-05 ENCOUNTER — Other Ambulatory Visit: Payer: Self-pay | Admitting: *Deleted

## 2014-04-05 MED ORDER — HYDROCODONE-ACETAMINOPHEN 5-325 MG PO TABS: ORAL_TABLET | ORAL | Status: DC

## 2014-04-05 NOTE — Telephone Encounter (Signed)
[facility] 

## 2014-04-25 ENCOUNTER — Non-Acute Institutional Stay (SKILLED_NURSING_FACILITY): Payer: PRIVATE HEALTH INSURANCE | Admitting: Internal Medicine

## 2014-04-25 DIAGNOSIS — I10 Essential (primary) hypertension: Secondary | ICD-10-CM

## 2014-04-25 DIAGNOSIS — K219 Gastro-esophageal reflux disease without esophagitis: Secondary | ICD-10-CM

## 2014-04-25 DIAGNOSIS — G309 Alzheimer's disease, unspecified: Secondary | ICD-10-CM

## 2014-04-25 DIAGNOSIS — F0280 Dementia in other diseases classified elsewhere without behavioral disturbance: Secondary | ICD-10-CM

## 2014-04-25 DIAGNOSIS — I2510 Atherosclerotic heart disease of native coronary artery without angina pectoris: Secondary | ICD-10-CM

## 2014-04-28 NOTE — Progress Notes (Addendum)
         PROGRESS NOTE  DATE: 04-25-14  [facility]  LEVEL OF CARE: SNF (31)  Routine Visit  CHIEF COMPLAINT:  Manage dementia, CAD, and GERD  HISTORY OF PRESENT ILLNESS:  REASSESSMENT OF ONGOING PROBLEM(S):  DEMENTIA: The dementia remaines stable and continues to function adequately in the current living environment with supervision.  The patient has had little changes in behavior. No complications noted from the medications presently being used. Patient does not follow commands.  CAD: The angina has been stable. The staff deny dyspnea on exertion, orthopnea, pedal edema, palpitations and paroxysmal nocturnal dyspnea. No complications noted from the medication presently being used.  GERD: pt's GERD is stable. staff Deny ongoing heartburn, abd. Pain, nausea or vomiting.  Currently on a PPI & tolerates it without any adverse reactions.  PAST MEDICAL HISTORY : Reviewed.  No changes.  CURRENT MEDICATIONS: [facility]  REVIEW OF SYSTEMS: Unobtainable due to dementia  PHYSICAL EXAMINATION  VS:  See vital signs section  GENERAL: no acute distress, normal body habitus EYES: conjunctivae normal, sclerae normal, normal eye lids NECK: supple, trachea midline, no neck masses, no thyroid tenderness, no thyromegaly LYMPHATICS: no LAN in the neck, no supraclavicular LAN RESPIRATORY: breathing is even & unlabored, BS CTAB CARDIAC: RRR, no murmur,no extra heart sounds, no edema GI: abdomen soft, normal BS, no masses, no tenderness, no hepatomegaly, no splenomegaly PSYCHIATRIC: the patient is alert & disoriented, affect & behavior appropriate  LABS/RADIOLOGY: 7-15 CBC normal 5-15 hemoglobin A1c 6.4 4-15 CMP normal 3-15 vitamin D level 43.6 1-15 CBC normal 9-14 CMP normal  7/14 cbc nl  5/14 BMP normal 4/14 CMP normal 1/14 CBC normal, B12 1642, folate 13.9  ASSESSMENT/PLAN:  Alzheimer's  dementia-advanced. CAD-stable. GERD-stable. hypertension-well controlled pain-on routine hydrocodone. Diabetes mellitus- well controlled  CPT CODE: [id number]  Newton PiggGayani Y. Kerry Doryasanayaka, MD [facility] [phone]

## 2014-05-07 ENCOUNTER — Other Ambulatory Visit: Payer: Self-pay | Admitting: *Deleted

## 2014-05-07 MED ORDER — HYDROCODONE-ACETAMINOPHEN 5-325 MG PO TABS: ORAL_TABLET | ORAL | Status: AC

## 2014-05-07 NOTE — Telephone Encounter (Signed)
[facility] 

## 2014-06-06 ENCOUNTER — Emergency Department (HOSPITAL_COMMUNITY): Payer: Medicare Other

## 2014-06-06 ENCOUNTER — Inpatient Hospital Stay (HOSPITAL_COMMUNITY)
Admission: EM | Admit: 2014-06-06 | Discharge: 2014-06-19 | DRG: 871 | Disposition: E | Payer: Medicare Other | Attending: Internal Medicine | Admitting: Internal Medicine

## 2014-06-06 ENCOUNTER — Encounter (HOSPITAL_COMMUNITY): Payer: Self-pay | Admitting: Emergency Medicine

## 2014-06-06 DIAGNOSIS — Z79899 Other long term (current) drug therapy: Secondary | ICD-10-CM | POA: Diagnosis not present

## 2014-06-06 DIAGNOSIS — Z7401 Bed confinement status: Secondary | ICD-10-CM

## 2014-06-06 DIAGNOSIS — G309 Alzheimer's disease, unspecified: Secondary | ICD-10-CM | POA: Diagnosis present

## 2014-06-06 DIAGNOSIS — Z79891 Long term (current) use of opiate analgesic: Secondary | ICD-10-CM | POA: Diagnosis not present

## 2014-06-06 DIAGNOSIS — E119 Type 2 diabetes mellitus without complications: Secondary | ICD-10-CM | POA: Diagnosis present

## 2014-06-06 DIAGNOSIS — A419 Sepsis, unspecified organism: Secondary | ICD-10-CM | POA: Diagnosis present

## 2014-06-06 DIAGNOSIS — I2510 Atherosclerotic heart disease of native coronary artery without angina pectoris: Secondary | ICD-10-CM | POA: Diagnosis present

## 2014-06-06 DIAGNOSIS — R140 Abdominal distension (gaseous): Secondary | ICD-10-CM

## 2014-06-06 DIAGNOSIS — Z66 Do not resuscitate: Secondary | ICD-10-CM | POA: Diagnosis present

## 2014-06-06 DIAGNOSIS — E43 Unspecified severe protein-calorie malnutrition: Secondary | ICD-10-CM | POA: Diagnosis present

## 2014-06-06 DIAGNOSIS — Z6823 Body mass index (BMI) 23.0-23.9, adult: Secondary | ICD-10-CM

## 2014-06-06 DIAGNOSIS — R6520 Severe sepsis without septic shock: Secondary | ICD-10-CM | POA: Diagnosis present

## 2014-06-06 DIAGNOSIS — K3189 Other diseases of stomach and duodenum: Secondary | ICD-10-CM | POA: Diagnosis present

## 2014-06-06 DIAGNOSIS — K219 Gastro-esophageal reflux disease without esophagitis: Secondary | ICD-10-CM | POA: Diagnosis present

## 2014-06-06 DIAGNOSIS — Z7982 Long term (current) use of aspirin: Secondary | ICD-10-CM

## 2014-06-06 DIAGNOSIS — I10 Essential (primary) hypertension: Secondary | ICD-10-CM | POA: Diagnosis present

## 2014-06-06 DIAGNOSIS — F0280 Dementia in other diseases classified elsewhere without behavioral disturbance: Secondary | ICD-10-CM | POA: Diagnosis present

## 2014-06-06 DIAGNOSIS — J690 Pneumonitis due to inhalation of food and vomit: Secondary | ICD-10-CM | POA: Diagnosis present

## 2014-06-06 DIAGNOSIS — D696 Thrombocytopenia, unspecified: Secondary | ICD-10-CM | POA: Diagnosis present

## 2014-06-06 DIAGNOSIS — E860 Dehydration: Secondary | ICD-10-CM | POA: Diagnosis present

## 2014-06-06 DIAGNOSIS — R112 Nausea with vomiting, unspecified: Secondary | ICD-10-CM | POA: Diagnosis not present

## 2014-06-06 DIAGNOSIS — K5900 Constipation, unspecified: Secondary | ICD-10-CM | POA: Diagnosis present

## 2014-06-06 DIAGNOSIS — Z794 Long term (current) use of insulin: Secondary | ICD-10-CM | POA: Diagnosis not present

## 2014-06-06 DIAGNOSIS — R0602 Shortness of breath: Secondary | ICD-10-CM

## 2014-06-06 DIAGNOSIS — F329 Major depressive disorder, single episode, unspecified: Secondary | ICD-10-CM | POA: Diagnosis present

## 2014-06-06 DIAGNOSIS — J189 Pneumonia, unspecified organism: Secondary | ICD-10-CM

## 2014-06-06 DIAGNOSIS — N179 Acute kidney failure, unspecified: Secondary | ICD-10-CM | POA: Diagnosis present

## 2014-06-06 DIAGNOSIS — D709 Neutropenia, unspecified: Secondary | ICD-10-CM | POA: Diagnosis present

## 2014-06-06 DIAGNOSIS — J9600 Acute respiratory failure, unspecified whether with hypoxia or hypercapnia: Secondary | ICD-10-CM | POA: Diagnosis present

## 2014-06-06 DIAGNOSIS — E538 Deficiency of other specified B group vitamins: Secondary | ICD-10-CM | POA: Diagnosis present

## 2014-06-06 DIAGNOSIS — D6959 Other secondary thrombocytopenia: Secondary | ICD-10-CM | POA: Diagnosis present

## 2014-06-06 DIAGNOSIS — H409 Unspecified glaucoma: Secondary | ICD-10-CM | POA: Diagnosis present

## 2014-06-06 DIAGNOSIS — F4310 Post-traumatic stress disorder, unspecified: Secondary | ICD-10-CM | POA: Diagnosis present

## 2014-06-06 DIAGNOSIS — Z888 Allergy status to other drugs, medicaments and biological substances status: Secondary | ICD-10-CM | POA: Diagnosis not present

## 2014-06-06 LAB — COMPREHENSIVE METABOLIC PANEL
ALBUMIN: 3.4 g/dL — AB (ref 3.5–5.2)
ALK PHOS: 56 U/L (ref 39–117)
ALT: 9 U/L (ref 0–53)
ANION GAP: 17 — AB (ref 5–15)
AST: 20 U/L (ref 0–37)
BILIRUBIN TOTAL: 0.4 mg/dL (ref 0.3–1.2)
BUN: 27 mg/dL — AB (ref 6–23)
CHLORIDE: 103 meq/L (ref 96–112)
CO2: 21 mEq/L (ref 19–32)
Calcium: 9 mg/dL (ref 8.4–10.5)
Creatinine, Ser: 1.67 mg/dL — ABNORMAL HIGH (ref 0.50–1.35)
GFR calc Af Amer: 41 mL/min — ABNORMAL LOW (ref >90)
GFR calc non Af Amer: 36 mL/min — ABNORMAL LOW (ref >90)
Glucose, Bld: 135 mg/dL — ABNORMAL HIGH (ref 70–99)
POTASSIUM: 4.4 meq/L (ref 3.7–5.3)
SODIUM: 141 meq/L (ref 137–147)
Total Protein: 7.4 g/dL (ref 6.0–8.3)

## 2014-06-06 LAB — CBC WITH DIFFERENTIAL/PLATELET
BASOS ABS: 0 10*3/uL (ref 0.0–0.1)
Basophils Relative: 0 % (ref 0–1)
Eosinophils Absolute: 0 10*3/uL (ref 0.0–0.7)
Eosinophils Relative: 0 % (ref 0–5)
HEMATOCRIT: 43 % (ref 39.0–52.0)
HEMOGLOBIN: 14.4 g/dL (ref 13.0–17.0)
LYMPHS PCT: 22 % (ref 12–46)
Lymphs Abs: 0.2 10*3/uL — ABNORMAL LOW (ref 0.7–4.0)
MCH: 29.6 pg (ref 26.0–34.0)
MCHC: 33.5 g/dL (ref 30.0–36.0)
MCV: 88.3 fL (ref 78.0–100.0)
Monocytes Absolute: 0.1 10*3/uL (ref 0.1–1.0)
Monocytes Relative: 7 % (ref 3–12)
NEUTROS ABS: 0.8 10*3/uL — AB (ref 1.7–7.7)
Neutrophils Relative %: 71 % (ref 43–77)
Platelets: 145 10*3/uL — ABNORMAL LOW (ref 150–400)
RBC: 4.87 MIL/uL (ref 4.22–5.81)
RDW: 13.9 % (ref 11.5–15.5)
WBC Morphology: INCREASED
WBC: 1.1 10*3/uL — CL (ref 4.0–10.5)

## 2014-06-06 MED ORDER — VANCOMYCIN HCL IN DEXTROSE 1-5 GM/200ML-% IV SOLN: 1000.0000 mg | Freq: Once | INTRAVENOUS | Status: DC

## 2014-06-06 MED ORDER — SODIUM CHLORIDE 0.9 % IV SOLN
Freq: Once | INTRAVENOUS | Status: AC
  Administered 2014-06-06: 22:00:00 via INTRAVENOUS

## 2014-06-06 MED ORDER — SODIUM CHLORIDE 0.9 % IV BOLUS (SEPSIS)
1000.0000 mL | Freq: Once | INTRAVENOUS | Status: AC
  Administered 2014-06-07: 1000 mL via INTRAVENOUS

## 2014-06-06 MED ORDER — DEXTROSE 5 % IV SOLN
1.0000 g | Freq: Once | INTRAVENOUS | Status: AC
  Administered 2014-06-06: 1 g via INTRAVENOUS
  Filled 2014-06-06: qty 1

## 2014-06-06 MED ORDER — VANCOMYCIN HCL 10 G IV SOLR
1250.0000 mg | INTRAVENOUS | Status: DC
  Administered 2014-06-07 – 2014-06-08 (×3): 1250 mg via INTRAVENOUS
  Filled 2014-06-06 (×3): qty 1250

## 2014-06-06 MED ORDER — CLINDAMYCIN PHOSPHATE 900 MG/50ML IV SOLN
900.0000 mg | Freq: Once | INTRAVENOUS | Status: AC
Start: 2014-06-06 — End: 2014-06-07
  Administered 2014-06-06: 900 mg via INTRAVENOUS
  Filled 2014-06-06: qty 50

## 2014-06-06 NOTE — Progress Notes (Signed)
ANTIBIOTIC CONSULT NOTE - INITIAL  Pharmacy Consult for Vancomycin Indication: pneumonia  Allergies  Allergen Reactions  . Simvastatin     Patient Measurements: Height: 6\' 1"  (185.4 cm) Weight: 220 lb (99.791 kg) IBW/kg (Calculated) : 79.9  Vital Signs: Temp: 100.6 F (38.1 C) (11/18 1944) Temp Source: Rectal (11/18 1944) BP: 128/62 mmHg (11/18 1944) Pulse Rate: 124 (11/18 1944) Intake/Output from previous day:   Intake/Output from this shift:    Labs:  Recent Labs  12/29/13 2140  CREATININE 1.67*   Estimated Creatinine Clearance: 40.2 mL/min (by C-G formula based on Cr of 1.67). No results for input(s): VANCOTROUGH, VANCOPEAK, VANCORANDOM, GENTTROUGH, GENTPEAK, GENTRANDOM, TOBRATROUGH, TOBRAPEAK, TOBRARND, AMIKACINPEAK, AMIKACINTROU, AMIKACIN in the last 72 hours.   Microbiology: No results found for this or any previous visit (from the past 720 hour(s)).  Medical History: Past Medical History  Diagnosis Date  . Coronary artery disease   . Hypertension   . Alzheimer's dementia   . GERD (gastroesophageal reflux disease)   . PTSD (post-traumatic stress disorder)   . Coronary atherosclerosis of native coronary artery 12/19/2012  . Essential hypertension, benign 12/19/2012  . Esophageal reflux 12/19/2012  . Alzheimer's disease 12/19/2012  . Anemia   . Hypotension     Hx: of orthostatic  . Depression     Hx: of depressive D/O  . Vitamin B12 deficiency     Hx: of  . Glaucoma     Hx: of Bilateral eyes    Medications:  Scheduled:   Infusions:  . ceFEPime (MAXIPIME) IV 1 g (12/29/13 2224)  . clindamycin (CLEOCIN) IV    . vancomycin     PRN:   Assessment: 80 [facility]. Xrays done at the facility show possible pneumonia and bowel obstruction. Pharmacy consulted to dose vancomycin for pneumonia.  11/18 >> Vancomycin >> 11/18 >> Cefepime x 1 11/18 >> Clindamycin x 1  Tmax: 100.6 WBC: pending Renal: SCr 1.67, CrCl  40CG, 32N  11/18 blood:  Goal of Therapy:  Vancomycin trough level 15-20 mcg/ml  Plan:   Vancomycin 1250mg  IV q24h Check trough at steady state Follow up renal function & cultures Continue cefepime?  Loralee PacasErin Loryn Haacke, PharmD, BCPS Pager: [phone] 2013/12/25,10:27 PM

## 2014-06-06 NOTE — ED Notes (Signed)
WBC 1.1, RN and I.Knapp MD notified

## 2014-06-06 NOTE — ED Notes (Signed)
Bed: [id number] Expected date:  Expected time:  Means of arrival:  Comments: Bowel obstruction

## 2014-06-06 NOTE — ED Provider Notes (Signed)
CSN: [id number]     Arrival date & time 06/17/2014  1935 History   First MD Initiated Contact with Patient 17-Jun-2014 2039     Chief Complaint  Patient presents with  . Emesis     (Consider location/radiation/quality/duration/timing/severity/associated sxs/prior Treatment) HPI  Patient has dementia and is nonambulatory and is nonverbal. Patient cannot feed himself and has to be fed.Family reports he was fine last night when they visited him. They state he started to have vomiting this morning. His daughter reports at 1:00 she saw him have some vomiting and he appeared to be choking. He also has not had a bowel movement for about 10 days and he got a laxative today. He last vomited and had a bowel movement about 6 PM. They report x-rays were done at the nursing home and there was some concern that he had a blockage in his intestines.    PCP Dr Selena Batten [facility] physician Dr Eula Listen  Past Medical History  Diagnosis Date  . Coronary artery disease   . Hypertension   . Alzheimer's dementia   . GERD (gastroesophageal reflux disease)   . PTSD (post-traumatic stress disorder)   . Coronary atherosclerosis of native coronary artery 12/19/2012  . Essential hypertension, benign 12/19/2012  . Esophageal reflux 12/19/2012  . Alzheimer's disease 12/19/2012  . Anemia   . Hypotension     Hx: of orthostatic  . Depression     Hx: of depressive D/O  . Vitamin B12 deficiency     Hx: of  . Glaucoma     Hx: of Bilateral eyes   Past Surgical History  Procedure Laterality Date  . Multiple tooth extractions  07/27/2012  . Hernia repair    . Thyroid surgery    . Middle ear surgery    . Multiple extractions with alveoloplasty N/A 07/27/2013    Procedure: MULTIPLE SURGICAL EXTRACTIONS WITH SUB-QUADRATIC ALVEOLOPLASTY;  Surgeon: Hinton Dyer, DDS;  Location: [facility] OR;  Service: Oral Surgery;  Laterality: N/A;   History reviewed. No pertinent family history. History  Substance Use Topics  . Smoking status: Never Smoker    . Smokeless tobacco: Never Used  . Alcohol Use: No  lives in NH  Review of Systems  Unable to perform ROS: Dementia      Allergies  Simvastatin  Home Medications   Prior to Admission medications   Medication Sig Start Date End Date Taking? Authorizing Provider  aspirin 81 MG chewable tablet Chew 81 mg by mouth daily.   Yes Historical Provider, MD  Cholecalciferol (VITAMIN D3) 1000 UNITS CAPS Take 1 capsule by mouth daily.   Yes Historical Provider, MD  feeding supplement, RESOURCE BREEZE, (RESOURCE BREEZE) LIQD Take 1 Container by mouth 3 (three) times daily between meals.   Yes Historical Provider, MD  HYDROcodone-acetaminophen (NORCO/VICODIN) 5-325 MG per tablet Take one tablet by mouth once daily for pain 05/07/14  Yes Tiffany L Reed, DO  insulin lispro (HUMALOG) 100 UNIT/ML injection Inject 2-10 Units into the skin. Before meals and at bedtime with sliding scale: 200-250= 2 units 251-300= 4 units  301-350= 6 units 351-400= 8 units >400= 10 units   Yes Historical Provider, MD  lisinopril (PRINIVIL,ZESTRIL) 5 MG tablet Take 5 mg by mouth daily.   Yes Historical Provider, MD  metFORMIN (GLUCOPHAGE) 500 MG tablet Take 500 mg by mouth daily with breakfast.   Yes Historical Provider, MD  NONFORMULARY OR COMPOUNDED ITEM Apply 1 application topically 3 (three) times daily. Baclofen 3%/ clonidine 0.2%/ diclofenac 4%/ gabapentin  6%/ lidocaine 3% cream   Yes Historical Provider, MD  promethazine (PHENERGAN) 25 MG tablet Take 25 mg by mouth every 4 (four) hours as needed for nausea or vomiting.   Yes Historical Provider, MD  ranitidine (ZANTAC) 150 MG tablet Take 150 mg by mouth daily.    Yes Historical Provider, MD  Travoprost, BAK Free, (TRAVATAN) 0.004 % SOLN ophthalmic solution Place 1 drop into both eyes at bedtime.   Yes Historical Provider, MD   BP 128/62 mmHg  Pulse 124  Temp(Src) 100.6 F (38.1 C) (Rectal)  Resp 42  Ht 6\' 1"  (1.854 m)  Wt 220 lb (99.791 kg)  BMI 29.03  kg/m2  SpO2 92%  Vital signs normal except for tachycardia and tachypnea   Physical Exam  Constitutional: He appears well-developed and well-nourished.  Non-toxic appearance. He does not appear ill. No distress.  HENT:  Head: Normocephalic and atraumatic.  Right Ear: External ear normal.  Left Ear: External ear normal.  Nose: Nose normal. No mucosal edema or rhinorrhea.  Mouth/Throat: Oropharynx is clear and moist and mucous membranes are normal. No dental abscesses or uvula swelling.  Eyes: Conjunctivae and EOM are normal. Pupils are equal, round, and reactive to light.  Neck: Normal range of motion and full passive range of motion without pain. Neck supple.  Cardiovascular: Normal rate, regular rhythm and normal heart sounds.  Exam reveals no gallop and no friction rub.   No murmur heard. Pulmonary/Chest: Tachypnea noted. He is in respiratory distress. He has no wheezes. He has no rhonchi. He has rales. He exhibits no tenderness and no crepitus.  Abdominal: Soft. Normal appearance and bowel sounds are normal. He exhibits no distension. There is no tenderness. There is no rebound and no guarding.  Musculoskeletal: Normal range of motion. He exhibits no edema or tenderness.  Moves all extremities well.   Neurological:  Patient does not respond verbally to verbal or tactile stimulation. He does not open his eyes to verbal  Skin: Skin is warm, dry and intact. No rash noted. No erythema. No pallor.  Psychiatric: He is slowed.  Nursing note and vitals reviewed.   ED Course  Procedures (including critical care time) Medications  vancomycin (VANCOCIN) 1,250 mg in sodium chloride 0.9 % 250 mL IVPB (1,250 mg Intravenous New Bag/Given 06/07/14 0006)  sodium chloride 0.9 % bolus 1,000 mL (not administered)  0.9 %  sodium chloride infusion ( Intravenous Stopped 06/07/14 0007)  ceFEPIme (MAXIPIME) 1 g in dextrose 5 % 50 mL IVPB (0 g Intravenous Stopped Sep 15, 2013 2249)  clindamycin (CLEOCIN)  IVPB 900 mg (0 mg Intravenous Stopped 06/07/14 0007)  sodium chloride 0.9 % bolus 1,000 mL (1,000 mLs Intravenous New Bag/Given 06/07/14 0006)     Per his nursing home records patient is listed as a full code. When I talk to his son he stated that was correct. I talked to the son and his 2 daughters that the patient has severe dementia, is very elderly, he has not been ambulatory and is nonverbal. They have come to agree that they did not want him to be placed on a ventilator or have CPR.   Patient was noted to have a very distended abdomen on his x-ray.I discussed with the family NG tube would help to decompress the stomach and also hopefully help lessen his vomiting so he would not aspirate more. An NG tube was placed and his abdomen became much softer.  Patient was started on healthcare associated pneumonia antibiotics with clindamycin for possible  aspiration pneumonia.  Family updated on his test results.   23:17 Dr Toniann Fail admit to step down.   Labs Review Results for orders placed or performed during the hospital encounter of 06/01/2014  CBC with Differential  Result Value Ref Range   WBC 1.1 (LL) 4.0 - 10.5 K/uL   RBC 4.87 4.22 - 5.81 MIL/uL   Hemoglobin 14.4 13.0 - 17.0 g/dL   HCT 16.1 09.6 - 04.5 %   MCV 88.3 78.0 - 100.0 fL   MCH 29.6 26.0 - 34.0 pg   MCHC 33.5 30.0 - 36.0 g/dL   RDW 40.9 81.1 - 91.4 %   Platelets 145 (L) 150 - 400 K/uL   Neutrophils Relative % 71 43 - 77 %   Lymphocytes Relative 22 12 - 46 %   Monocytes Relative 7 3 - 12 %   Eosinophils Relative 0 0 - 5 %   Basophils Relative 0 0 - 1 %   Neutro Abs 0.8 (L) 1.7 - 7.7 K/uL   Lymphs Abs 0.2 (L) 0.7 - 4.0 K/uL   Monocytes Absolute 0.1 0.1 - 1.0 K/uL   Eosinophils Absolute 0.0 0.0 - 0.7 K/uL   Basophils Absolute 0.0 0.0 - 0.1 K/uL   WBC Morphology INCREASED BANDS (>20% BANDS)   Comprehensive metabolic panel  Result Value Ref Range   Sodium 141 137 - 147 mEq/L   Potassium 4.4 3.7 - 5.3 mEq/L    Chloride 103 96 - 112 mEq/L   CO2 21 19 - 32 mEq/L   Glucose, Bld 135 (H) 70 - 99 mg/dL   BUN 27 (H) 6 - 23 mg/dL   Creatinine, Ser 7.82 (H) 0.50 - 1.35 mg/dL   Calcium 9.0 8.4 - 95.6 mg/dL   Total Protein 7.4 6.0 - 8.3 g/dL   Albumin 3.4 (L) 3.5 - 5.2 g/dL   AST 20 0 - 37 U/L   ALT 9 0 - 53 U/L   Alkaline Phosphatase 56 39 - 117 U/L   Total Bilirubin 0.4 0.3 - 1.2 mg/dL   GFR calc non Af Amer 36 (L) >90 mL/min   GFR calc Af Amer 41 (L) >90 mL/min   Anion gap 17 (H) 5 - 15   Laboratory interpretation all normal except for neutropenia with increased bands consistent with sepsis, new acute renal insufficiency     Imaging Review Dg Abd 1 View  06/10/2014   CLINICAL DATA:  Status post nasogastric placement  EXAM: ABDOMEN - 1 VIEW  COMPARISON:  Film from earlier in the same day.  FINDINGS: A nasogastric catheter is now noted within the stomach. There is been interval significant decompression of the stomach seen. Scattered large and small bowel gas is noted. No other focal abnormality is noted.  IMPRESSION: Decompression of the stomach following nasogastric catheter placement   Electronically Signed   By: Alcide Clever M.D.   On: 05/30/2014 22:35   Dg Chest Port 1 View  06/04/2014   CLINICAL DATA:  Shortness of breath  EXAM: PORTABLE CHEST - 1 VIEW  COMPARISON:  07/27/2013  FINDINGS: Cardiac shadow is stable. Vascular congestion is noted as well as patchy infiltrates in the bases bilaterally slightly worse on the left than the right. No acute bony abnormality is noted.  IMPRESSION: Bibasilar infiltrates superimposed over mild vascular congestion. No interstitial edema is seen.   Electronically Signed   By: Alcide Clever M.D.   On: 06/08/2014 21:23   Dg Abd Portable 2v  06/02/2014   CLINICAL DATA:  Abdominal distention, emesis  EXAM: PORTABLE ABDOMEN - 2 VIEW  COMPARISON:  06/02/2008  FINDINGS: Stomach is significantly distended. No free air is seen. A relative paucity of bowel gas is noted  in the remainder of the bowel. Fecal material is noted within the rectum. Postsurgical changes are seen.  IMPRESSION: Gaseous distension of the stomach of uncertain etiology.   Electronically Signed   By: Alcide CleverMark  Lukens M.D.   On: 05/23/2014 21:25     EKG Interpretation None      MDM   Final diagnoses:  Constipated  Healthcare-associated pneumonia  Aspiration pneumonia, unspecified aspiration pneumonia type  Neutropenia  Gastric distention     Plan admission  Devoria AlbeIva Naviyah Schaffert, MD, Franz DellFACEP    Clothilde Tippetts L Refugio Vandevoorde, MD 06/07/14 0010

## 2014-06-06 NOTE — ED Notes (Signed)
[facility] with c/o emesis.  Facility reported that pt has been having emesis since this morning.  Abdominal and chest x-rays were done at the facility and resulted with "possible pneumonia and bowel obstruction"  Pt presents to ED with some shortness of breath; pt is normally non-verbal.

## 2014-06-07 ENCOUNTER — Inpatient Hospital Stay (HOSPITAL_COMMUNITY): Payer: Medicare Other

## 2014-06-07 ENCOUNTER — Encounter (HOSPITAL_COMMUNITY): Payer: Self-pay | Admitting: Internal Medicine

## 2014-06-07 DIAGNOSIS — J690 Pneumonitis due to inhalation of food and vomit: Secondary | ICD-10-CM

## 2014-06-07 DIAGNOSIS — K56609 Unspecified intestinal obstruction, unspecified as to partial versus complete obstruction: Secondary | ICD-10-CM | POA: Insufficient documentation

## 2014-06-07 DIAGNOSIS — E119 Type 2 diabetes mellitus without complications: Secondary | ICD-10-CM

## 2014-06-07 DIAGNOSIS — A419 Sepsis, unspecified organism: Principal | ICD-10-CM

## 2014-06-07 DIAGNOSIS — K3189 Other diseases of stomach and duodenum: Secondary | ICD-10-CM

## 2014-06-07 DIAGNOSIS — N179 Acute kidney failure, unspecified: Secondary | ICD-10-CM

## 2014-06-07 LAB — COMPREHENSIVE METABOLIC PANEL
ALT: 9 U/L (ref 0–53)
AST: 28 U/L (ref 0–37)
Albumin: 2.7 g/dL — ABNORMAL LOW (ref 3.5–5.2)
Alkaline Phosphatase: 45 U/L (ref 39–117)
Anion gap: 20 — ABNORMAL HIGH (ref 5–15)
BUN: 29 mg/dL — ABNORMAL HIGH (ref 6–23)
CALCIUM: 8.2 mg/dL — AB (ref 8.4–10.5)
CO2: 16 meq/L — AB (ref 19–32)
CREATININE: 1.43 mg/dL — AB (ref 0.50–1.35)
Chloride: 107 mEq/L (ref 96–112)
GFR, EST AFRICAN AMERICAN: 50 mL/min — AB (ref >90)
GFR, EST NON AFRICAN AMERICAN: 43 mL/min — AB (ref >90)
Glucose, Bld: 108 mg/dL — ABNORMAL HIGH (ref 70–99)
Potassium: 4.6 mEq/L (ref 3.7–5.3)
SODIUM: 143 meq/L (ref 137–147)
TOTAL PROTEIN: 6.4 g/dL (ref 6.0–8.3)
Total Bilirubin: 0.5 mg/dL (ref 0.3–1.2)

## 2014-06-07 LAB — CBC WITH DIFFERENTIAL/PLATELET
BASOS PCT: 1 % (ref 0–1)
Basophils Absolute: 0 10*3/uL (ref 0.0–0.1)
EOS PCT: 0 % (ref 0–5)
Eosinophils Absolute: 0 10*3/uL (ref 0.0–0.7)
HCT: 41.5 % (ref 39.0–52.0)
HEMOGLOBIN: 13.5 g/dL (ref 13.0–17.0)
Lymphocytes Relative: 42 % (ref 12–46)
Lymphs Abs: 0.6 10*3/uL — ABNORMAL LOW (ref 0.7–4.0)
MCH: 28.8 pg (ref 26.0–34.0)
MCHC: 32.5 g/dL (ref 30.0–36.0)
MCV: 88.7 fL (ref 78.0–100.0)
MONOS PCT: 10 % (ref 3–12)
Monocytes Absolute: 0.2 10*3/uL (ref 0.1–1.0)
NEUTROS PCT: 47 % (ref 43–77)
Neutro Abs: 0.7 10*3/uL — ABNORMAL LOW (ref 1.7–7.7)
Platelets: 149 10*3/uL — ABNORMAL LOW (ref 150–400)
RBC: 4.68 MIL/uL (ref 4.22–5.81)
RDW: 14.2 % (ref 11.5–15.5)
WBC: 1.5 10*3/uL — AB (ref 4.0–10.5)

## 2014-06-07 LAB — URINALYSIS, ROUTINE W REFLEX MICROSCOPIC
BILIRUBIN URINE: NEGATIVE
Glucose, UA: NEGATIVE mg/dL
Ketones, ur: NEGATIVE mg/dL
Leukocytes, UA: NEGATIVE
NITRITE: NEGATIVE
Protein, ur: NEGATIVE mg/dL
Specific Gravity, Urine: 1.015 (ref 1.005–1.030)
UROBILINOGEN UA: 0.2 mg/dL (ref 0.0–1.0)
pH: 5 (ref 5.0–8.0)

## 2014-06-07 LAB — GLUCOSE, CAPILLARY
GLUCOSE-CAPILLARY: 128 mg/dL — AB (ref 70–99)
GLUCOSE-CAPILLARY: 115 mg/dL — AB (ref 70–99)
GLUCOSE-CAPILLARY: 102 mg/dL — AB (ref 70–99)
Glucose-Capillary: 106 mg/dL — ABNORMAL HIGH (ref 70–99)
Glucose-Capillary: 107 mg/dL — ABNORMAL HIGH (ref 70–99)
Glucose-Capillary: 115 mg/dL — ABNORMAL HIGH (ref 70–99)
Glucose-Capillary: 119 mg/dL — ABNORMAL HIGH (ref 70–99)

## 2014-06-07 LAB — URINE MICROSCOPIC-ADD ON

## 2014-06-07 LAB — MRSA PCR SCREENING: MRSA by PCR: NEGATIVE

## 2014-06-07 MED ORDER — DEXTROSE 5 % IV SOLN
2.0000 g | Freq: Two times a day (BID) | INTRAVENOUS | Status: DC
  Administered 2014-06-07 – 2014-06-08 (×3): 2 g via INTRAVENOUS
  Filled 2014-06-07 (×5): qty 2

## 2014-06-07 MED ORDER — ACETAMINOPHEN 160 MG/5ML PO SOLN
650.0000 mg | ORAL | Status: DC | PRN
  Administered 2014-06-07: 650 mg via ORAL
  Filled 2014-06-07: qty 20.3

## 2014-06-07 MED ORDER — INFLUENZA VAC SPLIT QUAD 0.5 ML IM SUSY
0.5000 mL | PREFILLED_SYRINGE | INTRAMUSCULAR | Status: DC
  Filled 2014-06-07 (×2): qty 0.5

## 2014-06-07 MED ORDER — SODIUM CHLORIDE 0.9 % IV BOLUS (SEPSIS)
1000.0000 mL | Freq: Once | INTRAVENOUS | Status: AC
  Administered 2014-06-07: 1000 mL via INTRAVENOUS

## 2014-06-07 MED ORDER — ENOXAPARIN SODIUM 40 MG/0.4ML ~~LOC~~ SOLN
40.0000 mg | SUBCUTANEOUS | Status: DC
  Administered 2014-06-07 – 2014-06-08 (×2): 40 mg via SUBCUTANEOUS
  Filled 2014-06-07 (×3): qty 0.4

## 2014-06-07 MED ORDER — SODIUM CHLORIDE 0.9 % IV SOLN
INTRAVENOUS | Status: AC
Start: 2014-06-07 — End: 2014-06-08
  Administered 2014-06-07: 1000 mL via INTRAVENOUS
  Administered 2014-06-07: 15:00:00 via INTRAVENOUS

## 2014-06-07 MED ORDER — ONDANSETRON HCL 4 MG PO TABS: 4.0000 mg | ORAL_TABLET | Freq: Four times a day (QID) | ORAL | Status: DC | PRN

## 2014-06-07 MED ORDER — PNEUMOCOCCAL VAC POLYVALENT 25 MCG/0.5ML IJ INJ
0.5000 mL | INJECTION | INTRAMUSCULAR | Status: DC
  Filled 2014-06-07 (×2): qty 0.5

## 2014-06-07 MED ORDER — ONDANSETRON HCL 4 MG/2ML IJ SOLN: 4.0000 mg | Freq: Four times a day (QID) | INTRAMUSCULAR | Status: DC | PRN

## 2014-06-07 MED ORDER — INSULIN ASPART 100 UNIT/ML ~~LOC~~ SOLN
0.0000 [IU] | SUBCUTANEOUS | Status: DC
Start: 2014-06-07 — End: 2014-06-09
  Administered 2014-06-07: 1 [IU] via SUBCUTANEOUS

## 2014-06-07 MED ORDER — ACETAMINOPHEN 650 MG RE SUPP
650.0000 mg | Freq: Four times a day (QID) | RECTAL | Status: DC | PRN
  Administered 2014-06-08 (×2): 650 mg via RECTAL
  Filled 2014-06-07 (×2): qty 1

## 2014-06-07 MED ORDER — ACETAMINOPHEN 325 MG PO TABS: 650.0000 mg | ORAL_TABLET | Freq: Four times a day (QID) | ORAL | Status: DC | PRN

## 2014-06-07 MED ORDER — CHLORHEXIDINE GLUCONATE 0.12 % MT SOLN
15.0000 mL | Freq: Two times a day (BID) | OROMUCOSAL | Status: DC
  Filled 2014-06-07 (×6): qty 15

## 2014-06-07 MED ORDER — CETYLPYRIDINIUM CHLORIDE 0.05 % MT LIQD
7.0000 mL | Freq: Two times a day (BID) | OROMUCOSAL | Status: DC
  Administered 2014-06-07: 7 mL via OROMUCOSAL

## 2014-06-07 MED ORDER — LATANOPROST 0.005 % OP SOLN
1.0000 [drp] | Freq: Every day | OPHTHALMIC | Status: DC
  Administered 2014-06-07 (×2): 1 [drp] via OPHTHALMIC
  Filled 2014-06-07: qty 2.5

## 2014-06-07 NOTE — Progress Notes (Addendum)
Clinical Social Work Department BRIEF PSYCHOSOCIAL ASSESSMENT 06/07/2014  Patient:  Scott Caldwell,Scott Caldwell     Account Number:  [id number]     Admit date:  05/29/2014  Clinical Social Worker:  Garlan FairKIDD,Maximiliano Cromartie, LCSWA  Date/Time:  06/07/2014 02:30 PM  Referred by:  Physician  Date Referred:  06/07/2014 Referred for  SNF Placement   Other Referral:   Interview type:  Family Other interview type:    PSYCHOSOCIAL DATA Living Status:  FACILITY Admitted from facility:   Level of care:   Primary support name:  Lillard Anesvelyn Happe/spouse/ [phone] Primary support relationship to patient:  SPOUSE Degree of support available:   adequate    CURRENT CONCERNS Current Concerns  Post-Acute Placement   Other Concerns:    SOCIAL WORK ASSESSMENT / PLAN CSW received referral that pt [facility].    [facility] that pt is a long term [facility] at the facility under [employer].    CSW visited pt room. Pt daughter, Scott Caldwell and wife, Scott Caldwell present at bedside. CSW introduced self and explained role. CSW discussed with pt family that CSW is consulted with a pt is admitted from a facility. Pt daughter confirmed that pt is a [facility]. CSW provided supportive listening to pt daughter as she discussed pt current medical situation. Pt daughter discussed that pt son was coming to the hospital soon as well.  CSW discussed with pt family that unit CSW will be continuing to follow throughout hospitalization. Pt daughter expressed understanding.    CSW completed pt FL2 and [facility].    CSW to continue to follow to provide support and assist with pt return [facility] when medically stable for discharge.   Assessment/plan status:  Psychosocial Support/Ongoing Assessment of Needs Other assessment/ plan:   discharge planning   Information/referral to community resources:   [facility]    PATIENT'S/FAMILY'S  RESPONSE TO PLAN OF CARE: Per chart, pt disoriented x 4. Pt family supportive and actively involved in pt care. Pt daughter calm and cooperative throughout discussion. Pt daughter appreciative of CSW visit and support.    Loletta SpecterSuzanna Jowanna Loeffler, MSW, LCSW Clinical Social Work [phone]

## 2014-06-07 NOTE — Plan of Care (Signed)
Problem: Consults Goal: Diabetes Guidelines if Diabetic/Glucose > 140 If diabetic or lab glucose is > 140 mg/dl - Initiate Diabetes/Hyperglycemia Guidelines & Document Interventions  Outcome: Completed/Met Date Met:  06/07/14 CBG's ordered Q4.

## 2014-06-07 NOTE — Progress Notes (Signed)
INITIAL NUTRITION ASSESSMENT  DOCUMENTATION CODES Per approved criteria  -Not Applicable   INTERVENTION: -Diet advancement per MD -May benefit from supplementation as tolerated d/t hx of weight loss -RD to continue to monitor  NUTRITION DIAGNOSIS: Inadequate oral intake related to inability to eats evidenced by NPO status.   Goal: Pt to meet >/= 90% of their estimated nutrition needs    Monitor:  Lab order, total protein/energy intake, labs, weights, GI profile  Reason for Assessment: MST  80 y.o. male  Admitting Dx: Sepsis  ASSESSMENT: Scott Caldwell E Kemp is a 80 y.o. male with history of Alzheimer's dementia, diabetes mellitus, hypertension was brought to the ER after patient was found to have multiple episodes of nausea vomiting. As per patient's son patient is usually noncommunicative given patient's advanced dementia and is bedbound. Patient's family noticed that patient has been having multiple episodes of nausea vomiting yesterday  -Pt with dementia, and non-communicative, unable to provide food/nutrition hx -Per discussion with RN, pt NPO pending CT of abd and pelvis; no plan for diet advancement -MD noted pt with nausea, vomiting, and gastric distension for two days pta. Distension improved by NGT placement, and is able to have BMs -Previous medical records indicate a gradual progressive weight loss of 12 lbs in past one month, and 16 lbs in past 3 months (8% body weight loss, severe for time frame) -Did not perform nutrition focused physical exam pt was being prepped for CT; however suspect some degree of malnutrition d/t weight loss. Also suspect some degree of muscle atrophy due to bedbound status. -RD contacted family for information. Son reported pt with fair appetite pta, denied significant change in appetite. Noted only difficulty was pt maintaining hydration. Had not noticed significant wt loss.    Height: Ht Readings from Last 1 Encounters:  06/07/14 6\' 1"  (1.854  m)    Weight: Wt Readings from Last 1 Encounters:  06/07/14 181 lb 7 oz (82.3 kg)    Ideal Body Weight: 184 lb  % Ideal Body Weight: 98%  Wt Readings from Last 10 Encounters:  06/07/14 181 lb 7 oz (82.3 kg)  04/28/14 193 lb 4.8 oz (87.68 kg)  03/23/14 197 lb (89.359 kg)  02/24/14 197 lb (89.359 kg)  01/24/14 197 lb (89.359 kg)  12/18/13 155 lb (70.308 kg)  11/27/13 198 lb (89.812 kg)  07/27/13 204 lb 2.3 oz (92.6 kg)  11/10/12 187 lb (84.823 kg)    Usual Body Weight: 197 lb per previous med records  % Usual Body Weight: 92%  BMI:  Body mass index is 23.94 kg/(m^2).  Estimated Nutritional Needs: Kcal: 1650-1850 Protein: 85-95 gram Fluid: >/= 1700 ml daily  Skin: WDL  Diet Order: Diet NPO time specified  EDUCATION NEEDS: -No education needs identified at this time   Intake/Output Summary (Last 24 hours) at 06/07/14 1058 Last data filed at 06/07/14 0900  Gross per 24 hour  Intake 3466.66 ml  Output    625 ml  Net 2841.66 ml    Last BM: 11/19   Labs:   Recent Labs Lab 06/08/2014 2140 06/07/14 0545  NA 141 143  K 4.4 4.6  CL 103 107  CO2 21 16*  BUN 27* 29*  CREATININE 1.67* 1.43*  CALCIUM 9.0 8.2*  GLUCOSE 135* 108*    CBG (last 3)   Recent Labs  06/07/14 0156 06/07/14 0413 06/07/14 0810  GLUCAP 128* 115* 106*    Scheduled Meds: . antiseptic oral rinse  7 mL Mouth Rinse q12n4p  .  ceFEPime (MAXIPIME) IV  2 g Intravenous Q12H  . chlorhexidine  15 mL Mouth Rinse BID  . enoxaparin (LOVENOX) injection  40 mg Subcutaneous Q24H  . [START ON 06/08/2014] Influenza vac split quadrivalent PF  0.5 mL Intramuscular Tomorrow-1000  . insulin aspart  0-9 Units Subcutaneous 6 times per day  . latanoprost  1 drop Both Eyes QHS  . [START ON 06/08/2014] pneumococcal 23 valent vaccine  0.5 mL Intramuscular Tomorrow-1000  . vancomycin  1,250 mg Intravenous Q24H    Continuous Infusions: . sodium chloride 1,000 mL (06/07/14 0108)    Past Medical  History  Diagnosis Date  . Coronary artery disease   . Hypertension   . Alzheimer's dementia   . GERD (gastroesophageal reflux disease)   . PTSD (post-traumatic stress disorder)   . Coronary atherosclerosis of native coronary artery 12/19/2012  . Essential hypertension, benign 12/19/2012  . Esophageal reflux 12/19/2012  . Alzheimer's disease 12/19/2012  . Anemia   . Hypotension     Hx: of orthostatic  . Depression     Hx: of depressive D/O  . Vitamin B12 deficiency     Hx: of  . Glaucoma     Hx: of Bilateral eyes    Past Surgical History  Procedure Laterality Date  . Multiple tooth extractions  07/27/2012  . Hernia repair    . Thyroid surgery    . Middle ear surgery    . Multiple extractions with alveoloplasty N/A 07/27/2013    Procedure: MULTIPLE SURGICAL EXTRACTIONS WITH SUB-QUADRATIC ALVEOLOPLASTY;  Surgeon: Hinton DyerJoseph L Miller, DDS;  Location: [facility] OR;  Service: Oral Surgery;  Laterality: N/A;    Lloyd HugerSarah F Gabreal Worton MS RD LDN Clinical Dietitian Pager:[phone]

## 2014-06-07 NOTE — Progress Notes (Signed)
[facility]                                                                              Patient Demographics  Scott Caldwell, is a 80 y.o. male, DOB - 1926/08/29, WUJ:[id number]  Admit date - 2014-07-01   Admitting Physician Eduard Clos, MD  Outpatient Primary MD for the patient is Pearson Grippe, MD  LOS - 1   Chief Complaint  Patient presents with  . Emesis      HPI on 06/07/2014 Scott Caldwell is a 80 y.o. male with history of Alzheimer's dementia, diabetes mellitus, hypertension was brought to the ER after patient was found to have multiple episodes of nausea and vomiting. As per patient's son patient is usually noncommunicative given patient's advanced dementia and is bedbound. Patient's family noticed that patient had been having multiple episodes of nausea and vomiting yesterday. Patient was brought to the ER and in the ER, acute abdominal series done and showed gastric distention and chest x-ray showed bilateral infiltrates. Patient was found to be febrile and NG tube was placed following which repeat abdominal x-ray showed decompression of the gastric distention. Labs reveal severe leukopenia and mild thrombocytopenia possibly from developing sepsis. Patient did have a large bowel movement in the ER. Blood cultures were obtained and patient has been admitted for further management. Patient's family at this time after discussion has requested DO NOT RESUSCITATE status for the patient but is okay with continuing with IV fluids and antibiotics. Patient is noncommunicative and further history is not possible.  Assessment & Plan  Patient admitted early this morning by Dr. Midge Minium.  Agree with current assessment and plan.   Severe sepsis secondary to possible aspiration pneumonia versus intra-abdominal source -Patient was leukopenic, tachycardic, tachypneic and febrile upon admission -Chest x-ray showed bibasilar infiltrate superimposed over mild basilar  congestion -CT of the abdomen pending -Patient was started on broad-spectrum antibiotics with vancomycin and cefepime -UA: Negative for infection  Gastric distention with nausea and vomiting -Patient had NG tube placed -Pending CT of the abdomen and pelvis  Severe leukopenia and mild thrombocytopenia -Likely secondary to sepsis -Will continue to monitor CBC  Acute kidney injury -Possibly secondary to dehydration from vomiting versus sepsis versus medications -Will continue to monitor patient's intake and output -Will avoid nephrotoxic medications -Continue IV fluids  Diabetes mellitus, type II -Metformin currently held -Continue insulin sliding scale CBG monitoring  Hypertension -Lisinopril currently held as patient's blood pressures have been soft and patient has AK I -Will place on when necessary IV medications if needed  Advanced dementia  Code Status: DO NOT RESUSCITATE  Family Communication: None at bedside  Disposition Plan: Admitted  Time Spent in minutes   30 minutes  Procedures  None  Consults   None  DVT Prophylaxis  Lovenox  Lab Results  Component Value Date   PLT 149* 06/07/2014    Medications  Scheduled Meds: . antiseptic oral rinse  7 mL Mouth Rinse q12n4p  . ceFEPime (MAXIPIME) IV  2 g Intravenous Q12H  . chlorhexidine  15 mL Mouth Rinse BID  . enoxaparin (LOVENOX) injection  40 mg Subcutaneous Q24H  . [START ON 06/08/2014] Influenza vac  split quadrivalent PF  0.5 mL Intramuscular Tomorrow-1000  . insulin aspart  0-9 Units Subcutaneous 6 times per day  . latanoprost  1 drop Both Eyes QHS  . [START ON 06/08/2014] pneumococcal 23 valent vaccine  0.5 mL Intramuscular Tomorrow-1000  . vancomycin  1,250 mg Intravenous Q24H   Continuous Infusions: . sodium chloride 1,000 mL (06/07/14 0108)   PRN Meds:.acetaminophen (TYLENOL) oral liquid 160 mg/5 mL, acetaminophen **OR** acetaminophen, ondansetron **OR** ondansetron (ZOFRAN) IV  Antibiotics     Anti-infectives    Start     Dose/Rate Route Frequency Ordered Stop   06/07/14 0600  ceFEPIme (MAXIPIME) 2 g in dextrose 5 % 50 mL IVPB     2 g100 mL/hr over 30 Minutes Intravenous Every 12 hours 06/07/14 0518     05/27/2014 2300  vancomycin (VANCOCIN) 1,250 mg in sodium chloride 0.9 % 250 mL IVPB     1,250 mg166.7 mL/hr over 90 Minutes Intravenous Every 24 hours 06/03/2014 2226     06/03/2014 2200  ceFEPIme (MAXIPIME) 1 g in dextrose 5 % 50 mL IVPB     1 g100 mL/hr over 30 Minutes Intravenous  Once 06/14/2014 2132 06/04/2014 2249   05/23/2014 2200  vancomycin (VANCOCIN) IVPB 1000 mg/200 mL premix  Status:  Discontinued     1,000 mg200 mL/hr over 60 Minutes Intravenous  Once 06/15/2014 2147 05/28/2014 2225   06/05/2014 2145  clindamycin (CLEOCIN) IVPB 900 mg     900 mg100 mL/hr over 30 Minutes Intravenous  Once 05/22/2014 2132 06/07/14 0007        Subjective:   Scott Caldwell seen and examined today.  Patient has advanced dementia and is noncommunicative at baseline.    Objective:   Filed Vitals:   06/07/14 0200 06/07/14 0300 06/07/14 0330 06/07/14 0825  BP: 80/46 100/47 86/47 100/54  Pulse: 105 106 106   Temp: 101.8 F (38.8 C) 101.3 F (38.5 C) 101.3 F (38.5 C)   TempSrc:      Resp: 39 42 41   Height:      Weight:      SpO2: 92% 94% 95%     Wt Readings from Last 3 Encounters:  06/07/14 82.3 kg (181 lb 7 oz)  04/28/14 87.68 kg (193 lb 4.8 oz)  03/23/14 89.359 kg (197 lb)     Intake/Output Summary (Last 24 hours) at 06/07/14 1153 Last data filed at 06/07/14 0900  Gross per 24 hour  Intake 3466.66 ml  Output    625 ml  Net 2841.66 ml    Exam  General: Well developed, well nourished  HEENT: NCAT, mucous membranes moist.   Cardiovascular: S1 S2 auscultated, Tachycardic  Respiratory: Coarse breath sounds  Abdomen: Soft, nontender, distended, + bowel sounds  Extremities: warm dry without cyanosis clubbing or edema  Neuro: Unable to assess as patient has advanced dementia  and is noncommunicative at baseline  Skin: Without rashes exudates or nodules  Data Review   Micro Results Recent Results (from the past 240 hour(s))  MRSA PCR Screening     Status: None   Collection Time: 06/07/14 12:46 AM  Result Value Ref Range Status   MRSA by PCR NEGATIVE NEGATIVE Final    Comment:        The GeneXpert MRSA Assay (FDA approved for NASAL specimens only), is one component of a comprehensive MRSA colonization surveillance program. It is not intended to diagnose MRSA infection nor to guide or monitor treatment for MRSA infections.     Radiology Reports Ct  Abdomen Pelvis Wo Contrast  06/07/2014   CLINICAL DATA:  Abdominal pain, distention, nausea and vomiting.  EXAM: CT ABDOMEN AND PELVIS WITHOUT CONTRAST  TECHNIQUE: Multidetector CT imaging of the abdomen and pelvis was performed following the standard protocol without IV contrast.  COMPARISON:  06/02/2008  FINDINGS: Lower chest: There is a small left pleural effusion. Airspace consolidation is identified in both lower lobes.  Hepatobiliary: No focal liver abnormality. The gallbladder appears normal. There is no biliary dilatation.  Pancreas: The pancreas appears normal.  Spleen: Normal appearance of the spleen.  Adrenals/Urinary Tract: The adrenal glands are both normal. Cyst within the upper pole the right kidney measures 3.9 cm. This is incompletely characterized without IV contrast. The previously noted cyst within the inferior pole of the right kidney has resolved. The left kidney appears normal. The urinary bladder is collapsed chronic Foley catheter.  Stomach/Bowel: Nasogastric tube tip is in the body of the moderately distended stomach. There is enteric contrast material within normal caliber small bowel loops. The terminal ileum is visualized and appears normal. There is contrast identified within the ascending colon. There is moderate stool identified within the thick walled rectum.  Vascular/Lymphatic: Mild  calcified atherosclerotic disease involves the abdominal aorta. No aneurysm. Retro aortic left renal vein is identified. There is no retroperitoneal or mesenteric adenopathy. No pelvic or inguinal adenopathy.  Reproductive: Patient is status post prostatectomy and pelvic lymph node dissection.  Other: There is no ascites or focal fluid collections within the abdomen or pelvis.  Musculoskeletal: There is no aggressive lytic or sclerotic bone lesions identified. The osseous structures appear osteopenic.  IMPRESSION: 1. Bilateral lower lobe airspace consolidation consistent with multifocal pneumonia. 2. No evidence for bowel obstruction. There it remains moderate distension of the gastric lumen. However enteric contrast material is identified throughout the small bowel loops and into the proximal colon. 3. Moderate residual stool identified within the rectum. There is wall thickening of the rectum which may reflect sequelae of stercoral colitis 4. Atherosclerotic disease.   Electronically Signed   By: Signa Kell M.D.   On: 06/07/2014 11:07   Dg Abd 1 View  06/14/2014   CLINICAL DATA:  Status post nasogastric placement  EXAM: ABDOMEN - 1 VIEW  COMPARISON:  Film from earlier in the same day.  FINDINGS: A nasogastric catheter is now noted within the stomach. There is been interval significant decompression of the stomach seen. Scattered large and small bowel gas is noted. No other focal abnormality is noted.  IMPRESSION: Decompression of the stomach following nasogastric catheter placement   Electronically Signed   By: Alcide Clever M.D.   On: 06/02/2014 22:35   Dg Chest Port 1 View  05/24/2014   CLINICAL DATA:  Shortness of breath  EXAM: PORTABLE CHEST - 1 VIEW  COMPARISON:  07/27/2013  FINDINGS: Cardiac shadow is stable. Vascular congestion is noted as well as patchy infiltrates in the bases bilaterally slightly worse on the left than the right. No acute bony abnormality is noted.  IMPRESSION: Bibasilar  infiltrates superimposed over mild vascular congestion. No interstitial edema is seen.   Electronically Signed   By: Alcide Clever M.D.   On: 06/17/2014 21:23   Dg Abd Portable 2v  06/12/2014   CLINICAL DATA:  Abdominal distention, emesis  EXAM: PORTABLE ABDOMEN - 2 VIEW  COMPARISON:  06/02/2008  FINDINGS: Stomach is significantly distended. No free air is seen. A relative paucity of bowel gas is noted in the remainder of the bowel. Fecal material  is noted within the rectum. Postsurgical changes are seen.  IMPRESSION: Gaseous distension of the stomach of uncertain etiology.   Electronically Signed   By: Alcide CleverMark  Lukens M.D.   On: October 12, 2013 21:25    CBC  Recent Labs Lab 02-05-2014 2140 06/07/14 0545  WBC 1.1* 1.5*  HGB 14.4 13.5  HCT 43.0 41.5  PLT 145* 149*  MCV 88.3 88.7  MCH 29.6 28.8  MCHC 33.5 32.5  RDW 13.9 14.2  LYMPHSABS 0.2* 0.6*  MONOABS 0.1 0.2  EOSABS 0.0 0.0  BASOSABS 0.0 0.0    Chemistries   Recent Labs Lab 02-05-2014 2140 06/07/14 0545  NA 141 143  K 4.4 4.6  CL 103 107  CO2 21 16*  GLUCOSE 135* 108*  BUN 27* 29*  CREATININE 1.67* 1.43*  CALCIUM 9.0 8.2*  AST 20 28  ALT 9 9  ALKPHOS 56 45  BILITOT 0.4 0.5   ------------------------------------------------------------------------------------------------------------------ estimated creatinine clearance is 42.7 mL/min (by C-G formula based on Cr of 1.43). ------------------------------------------------------------------------------------------------------------------ No results for input(s): HGBA1C in the last 72 hours. ------------------------------------------------------------------------------------------------------------------ No results for input(s): CHOL, HDL, LDLCALC, TRIG, CHOLHDL, LDLDIRECT in the last 72 hours. ------------------------------------------------------------------------------------------------------------------ No results for input(s): TSH, T4TOTAL, T3FREE, THYROIDAB in the last 72  hours.  Invalid input(s): FREET3 ------------------------------------------------------------------------------------------------------------------ No results for input(s): VITAMINB12, FOLATE, FERRITIN, TIBC, IRON, RETICCTPCT in the last 72 hours.  Coagulation profile No results for input(s): INR, PROTIME in the last 168 hours.  No results for input(s): DDIMER in the last 72 hours.  Cardiac Enzymes No results for input(s): CKMB, TROPONINI, MYOGLOBIN in the last 168 hours.  Invalid input(s): CK ------------------------------------------------------------------------------------------------------------------ Invalid input(s): POCBNP    Scott Caldwell D.O. on 06/07/2014 at 11:53 AM  Between 7am to 7pm - Pager - [phone]  After 7pm go to [email or web address] - password [email or web address]  And look for the night coverage person covering for me after hours  [facility] Office  [phone]

## 2014-06-07 NOTE — H&P (Signed)
[facility] History and Physical  Scott Poehomas E Stradling ZOX:[id number] DOB: 06/29/1927 DOA: 02-05-14  Referring physician: ER physician. PCP: Pearson GrippeKIM, JAMES, MD   Chief Complaint: Nausea vomiting.  History obtained from patient's son.  HPI: Scott Caldwell is a 80 y.o. male with history of Alzheimer's dementia, diabetes mellitus, hypertension was brought to the ER after patient was found to have multiple episodes of nausea vomiting. As per patient's son patient is usually noncommunicative given patient's advanced dementia and is bedbound. Patient's family noticed that patient has been having multiple episodes of nausea vomiting yesterday. Patient was brought to the ER and in the ER acute abdominal series done showed gastric distention and chest x-ray showed bilateral infiltrates. Patient was found to be febrile and NG tube was placed following which repeat abdominal x-ray showed decompression of the gastric distention. Labs reveal severe leukopenia and mild thrombocytopenia possibly from developing sepsis. Patient did have a large bowel movement in the ER. Blood cultures were obtained and patient has been admitted for further management. Patient's family at this time after discussion has requested DO NOT RESUSCITATE status for the patient but is okay with continuing with IV fluids and antibiotics. Patient is noncommunicative and further history is not possible.  Review of Systems: As presented in the history of presenting illness, rest negative.  Past Medical History  Diagnosis Date  . Coronary artery disease   . Hypertension   . Alzheimer's dementia   . GERD (gastroesophageal reflux disease)   . PTSD (post-traumatic stress disorder)   . Coronary atherosclerosis of native coronary artery 12/19/2012  . Essential hypertension, benign 12/19/2012  . Esophageal reflux 12/19/2012  . Alzheimer's disease 12/19/2012  . Anemia   . Hypotension     Hx: of orthostatic  . Depression     Hx: of depressive D/O   . Vitamin B12 deficiency     Hx: of  . Glaucoma     Hx: of Bilateral eyes   Past Surgical History  Procedure Laterality Date  . Multiple tooth extractions  07/27/2012  . Hernia repair    . Thyroid surgery    . Middle ear surgery    . Multiple extractions with alveoloplasty N/A 07/27/2013    Procedure: MULTIPLE SURGICAL EXTRACTIONS WITH SUB-QUADRATIC ALVEOLOPLASTY;  Surgeon: Hinton DyerJoseph L Miller, DDS;  Location: [facility] OR;  Service: Oral Surgery;  Laterality: N/A;   Social History:  reports that he has never smoked. He has never used smokeless tobacco. He reports that he does not drink alcohol or use illicit drugs. Where does patient live in a nursing home. Can patient participate in ADLs? None.  Allergies  Allergen Reactions  . Simvastatin     Family History: History reviewed. No pertinent family history.    Prior to Admission medications   Medication Sig Start Date End Date Taking? Authorizing Provider  aspirin 81 MG chewable tablet Chew 81 mg by mouth daily.   Yes Historical Provider, MD  Cholecalciferol (VITAMIN D3) 1000 UNITS CAPS Take 1 capsule by mouth daily.   Yes Historical Provider, MD  feeding supplement, RESOURCE BREEZE, (RESOURCE BREEZE) LIQD Take 1 Container by mouth 3 (three) times daily between meals.   Yes Historical Provider, MD  HYDROcodone-acetaminophen (NORCO/VICODIN) 5-325 MG per tablet Take one tablet by mouth once daily for pain 05/07/14  Yes Tiffany L Reed, DO  insulin lispro (HUMALOG) 100 UNIT/ML injection Inject 2-10 Units into the skin. Before meals and at bedtime with sliding scale: 200-250= 2 units 251-300= 4 units  301-350= 6  units 351-400= 8 units >400= 10 units   Yes Historical Provider, MD  lisinopril (PRINIVIL,ZESTRIL) 5 MG tablet Take 5 mg by mouth daily.   Yes Historical Provider, MD  metFORMIN (GLUCOPHAGE) 500 MG tablet Take 500 mg by mouth daily with breakfast.   Yes Historical Provider, MD  NONFORMULARY OR COMPOUNDED ITEM Apply 1 application  topically 3 (three) times daily. Baclofen 3%/ clonidine 0.2%/ diclofenac 4%/ gabapentin 6%/ lidocaine 3% cream   Yes Historical Provider, MD  promethazine (PHENERGAN) 25 MG tablet Take 25 mg by mouth every 4 (four) hours as needed for nausea or vomiting.   Yes Historical Provider, MD  ranitidine (ZANTAC) 150 MG tablet Take 150 mg by mouth daily.    Yes Historical Provider, MD  Travoprost, BAK Free, (TRAVATAN) 0.004 % SOLN ophthalmic solution Place 1 drop into both eyes at bedtime.   Yes Historical Provider, MD    Physical Exam: Filed Vitals:   06/30/14 1944 2014/06/30 2208 06/30/14 2317  BP: 128/62  116/50  Pulse: 124  107  Temp: 100.6 F (38.1 C)    TempSrc: Rectal    Resp: 42  30  Height:  6\' 1"  (1.854 m)   Weight:  99.791 kg (220 lb)   SpO2: 92%  93%     General:  Moderately built and nourished.  Eyes: Anicteric no pallor.  ENT: No discharge from the ears eyes nose mouth.  Neck: No mass felt.  Cardiovascular: S1 and S2. Tachycardic.  Respiratory: No rhonchi bilateral coarse breath sounds.  Abdomen: Distended bowel sounds are appreciated.  Skin: No rash.  Musculoskeletal: No edema.  Psychiatric: Patient is demented and noncommunicative.  Neurologic: Patient is demented and noncommunicative.  Labs on Admission:  Basic Metabolic Panel:  Recent Labs Lab 06-30-2014 2140  NA 141  K 4.4  CL 103  CO2 21  GLUCOSE 135*  BUN 27*  CREATININE 1.67*  CALCIUM 9.0   Liver Function Tests:  Recent Labs Lab 06-30-2014 2140  AST 20  ALT 9  ALKPHOS 56  BILITOT 0.4  PROT 7.4  ALBUMIN 3.4*   No results for input(s): LIPASE, AMYLASE in the last 168 hours. No results for input(s): AMMONIA in the last 168 hours. CBC:  Recent Labs Lab June 30, 2014 2140  WBC 1.1*  NEUTROABS 0.8*  HGB 14.4  HCT 43.0  MCV 88.3  PLT 145*   Cardiac Enzymes: No results for input(s): CKTOTAL, CKMB, CKMBINDEX, TROPONINI in the last 168 hours.  BNP (last 3 results) No results for  input(s): PROBNP in the last 8760 hours. CBG: No results for input(s): GLUCAP in the last 168 hours.  Radiological Exams on Admission: Dg Abd 1 View  2014/06/30   CLINICAL DATA:  Status post nasogastric placement  EXAM: ABDOMEN - 1 VIEW  COMPARISON:  Film from earlier in the same day.  FINDINGS: A nasogastric catheter is now noted within the stomach. There is been interval significant decompression of the stomach seen. Scattered large and small bowel gas is noted. No other focal abnormality is noted.  IMPRESSION: Decompression of the stomach following nasogastric catheter placement   Electronically Signed   By: Alcide Clever M.D.   On: Jun 30, 2014 22:35   Dg Chest Port 1 View  06-30-2014   CLINICAL DATA:  Shortness of breath  EXAM: PORTABLE CHEST - 1 VIEW  COMPARISON:  07/27/2013  FINDINGS: Cardiac shadow is stable. Vascular congestion is noted as well as patchy infiltrates in the bases bilaterally slightly worse on the left than the right.  No acute bony abnormality is noted.  IMPRESSION: Bibasilar infiltrates superimposed over mild vascular congestion. No interstitial edema is seen.   Electronically Signed   By: Alcide CleverMark  Lukens M.D.   On: 05/26/2014 21:23   Dg Abd Portable 2v  06/18/2014   CLINICAL DATA:  Abdominal distention, emesis  EXAM: PORTABLE ABDOMEN - 2 VIEW  COMPARISON:  06/02/2008  FINDINGS: Stomach is significantly distended. No free air is seen. A relative paucity of bowel gas is noted in the remainder of the bowel. Fecal material is noted within the rectum. Postsurgical changes are seen.  IMPRESSION: Gaseous distension of the stomach of uncertain etiology.   Electronically Signed   By: Alcide CleverMark  Lukens M.D.   On: 05/31/2014 21:25    Assessment/Plan Principal Problem:   Sepsis Active Problems:   Essential hypertension, benign   Alzheimer's disease   Aspiration pneumonia   Gastric distention   Diabetes mellitus type 2, controlled   Acute renal failure   1. Sepsis probably from  aspiration and also from possible intra-abdominal source - blood cultures have been obtained and patient has been placed on vancomycin and cefepime. UA is pending. Continue with aggressive IV hydration. 2. Gastric distention - cause not known. At this time after NG tube placement patient's gastric distention has been decompressed. Check CT abdomen and pelvis with by mouth contrast. Patient did have a bowel movement in the ER. 3. Severe leukopenia and mild thrombocytopenia probably from sepsis - follow CBC. 4. Acute renal failure - probably from dehydration from vomiting. Hold antihypertensives. Check UA. Possible intake output and continue hydration. 5. Diabetes mellitus2 - hold metformin for now and closely follow CBG with sliding scale coverage. 6. Hypertension - hold lisinopril as patient's blood pressure is in the low normals. 7. Advanced dementia.    Code Status: DO NOT RESUSCITATE.  Family Communication: Patient's son and wife at the bedside.  Disposition Plan: Admit to inpatient.    Godfrey Tritschler N. [facility] Pager [phone].  If 7PM-7AM, please contact night-coverage [email or web address] Password [email or web address] 06/07/2014, 1:11 AM

## 2014-06-07 NOTE — Progress Notes (Signed)
Rx Brief Antibiotic  Note:  Cefepime  See note from 11/18 2227 for full note.  Plan:  Cefepime 1Gm x1 in ED then 2Gm IV q12h  F/u SCr/cultures as needed.   Lorenza EvangelistGreen, Chris Cripps R 06/07/2014 5:22 AM

## 2014-06-08 DIAGNOSIS — G309 Alzheimer's disease, unspecified: Secondary | ICD-10-CM

## 2014-06-08 DIAGNOSIS — I10 Essential (primary) hypertension: Secondary | ICD-10-CM

## 2014-06-08 DIAGNOSIS — K3189 Other diseases of stomach and duodenum: Secondary | ICD-10-CM

## 2014-06-08 LAB — BASIC METABOLIC PANEL
ANION GAP: 15 (ref 5–15)
BUN: 32 mg/dL — ABNORMAL HIGH (ref 6–23)
CHLORIDE: 106 meq/L (ref 96–112)
CO2: 21 mEq/L (ref 19–32)
Calcium: 8.3 mg/dL — ABNORMAL LOW (ref 8.4–10.5)
Creatinine, Ser: 1.28 mg/dL (ref 0.50–1.35)
GFR calc non Af Amer: 49 mL/min — ABNORMAL LOW (ref >90)
GFR, EST AFRICAN AMERICAN: 57 mL/min — AB (ref >90)
Glucose, Bld: 119 mg/dL — ABNORMAL HIGH (ref 70–99)
Potassium: 4.6 mEq/L (ref 3.7–5.3)
Sodium: 142 mEq/L (ref 137–147)

## 2014-06-08 LAB — GLUCOSE, CAPILLARY
GLUCOSE-CAPILLARY: 100 mg/dL — AB (ref 70–99)
Glucose-Capillary: 120 mg/dL — ABNORMAL HIGH (ref 70–99)
Glucose-Capillary: 106 mg/dL — ABNORMAL HIGH (ref 70–99)
Glucose-Capillary: 101 mg/dL — ABNORMAL HIGH (ref 70–99)
Glucose-Capillary: 111 mg/dL — ABNORMAL HIGH (ref 70–99)

## 2014-06-08 LAB — PATHOLOGIST SMEAR REVIEW

## 2014-06-08 MED ORDER — PIPERACILLIN-TAZOBACTAM 3.375 G IVPB
3.3750 g | Freq: Three times a day (TID) | INTRAVENOUS | Status: DC
  Administered 2014-06-08 (×2): 3.375 g via INTRAVENOUS
  Filled 2014-06-08 (×3): qty 50

## 2014-06-08 NOTE — Progress Notes (Signed)
Clinical Social Work  Patient was discussed during progression meeting and RN reports poor prognosis for patient. Per chart review, patient on non-breather mask and comfort care. CSW will continue to follow to assist with DC planning as needed.  Effingham[place]olly Makia Bossi, Kentucky[place]CSW [phone]

## 2014-06-08 NOTE — Progress Notes (Addendum)
[facility]                                                                              Patient Demographics  Scott Caldwell, is a 80 y.o. male, DOB - May 30, 1927, ZOX:[id number]  Admit date - 06/01/2014   Admitting Physician Eduard Clos, MD  Outpatient Primary MD for the patient is Pearson Grippe, MD  LOS - 2   Chief Complaint  Patient presents with  . Emesis      HPI on 06/07/2014 Scott Caldwell is a 80 y.o. male with history of Alzheimer's dementia, diabetes mellitus, hypertension was brought to the ER after patient was found to have multiple episodes of nausea and vomiting. As per patient's son patient is usually noncommunicative given patient's advanced dementia and is bedbound. Patient's family noticed that patient had been having multiple episodes of nausea and vomiting yesterday. Patient was brought to the ER and in the ER, acute abdominal series done and showed gastric distention and chest x-ray showed bilateral infiltrates. Patient was found to be febrile and NG tube was placed following which repeat abdominal x-ray showed decompression of the gastric distention. Labs reveal severe leukopenia and mild thrombocytopenia possibly from developing sepsis. Patient did have a large bowel movement in the ER. Blood cultures were obtained and patient has been admitted for further management. Patient's family at this time after discussion has requested DO NOT RESUSCITATE status for the patient but is okay with continuing with IV fluids and antibiotics. Patient is noncommunicative and further history is not possible.  Assessment & Plan   Severe sepsis secondary to possible aspiration pneumonia versus intra-abdominal source -Patient was leukopenic, tachycardic, tachypneic and febrile upon admission -Chest x-ray showed bibasilar infiltrate superimposed over mild basilar congestion -CT of the abdomen: Bilateral lower lobe airspace consolidation consistent with multifocal pneumonia, no  evidence of bowel obstruction, moderate residual stool identified , sterocoral colitis -Patient was started on broad-spectrum antibiotics with vancomycin and zosyn -UA: Negative for infection  Acute respiratory failure -Patient remains tachypneic and on nonrebreather -Patient's family made him DNR.  -Will continue to monitor -Possibly secondary to aspiration pneumonia  Gastric distention with nausea and vomiting -Patient had NG tube placed and set to suction -CT of the abdomen and pelvis: no evidence of bowel obstruction, moderate residual stool identified , sterocoral colitis -Will order enema  Severe leukopenia and mild thrombocytopenia -Likely secondary to sepsis -Will continue to monitor CBC  Acute kidney injury -Improving, Possibly secondary to dehydration from vomiting versus sepsis versus medications -Will continue to monitor patient's intake and output -Will avoid nephrotoxic medications -Continue IV fluids and monitor BMP  Diabetes mellitus, type II -Metformin currently held -Continue insulin sliding scale CBG monitoring  Hypertension -Lisinopril currently held as patient's blood pressures have been soft and patient has AK I -Will place on when necessary IV medications if needed  Advanced dementia -Patient is noncommunicative at baseline  Code Status: DO NOT RESUSCITATE  Family Communication: None at bedside, spoke with daughter via phone regarding patient's prognosis.  She would like for her and family to discuss possible comfort care, however, will wait until they come to the hospital.  Disposition Plan: Admitted  Time Spent  in minutes   30 minutes  Procedures  None  Consults   None  DVT Prophylaxis  Lovenox  Lab Results  Component Value Date   PLT 149* 06/07/2014    Medications  Scheduled Meds: . antiseptic oral rinse  7 mL Mouth Rinse q12n4p  . ceFEPime (MAXIPIME) IV  2 g Intravenous Q12H  . chlorhexidine  15 mL Mouth Rinse BID  . enoxaparin  (LOVENOX) injection  40 mg Subcutaneous Q24H  . Influenza vac split quadrivalent PF  0.5 mL Intramuscular Tomorrow-1000  . insulin aspart  0-9 Units Subcutaneous 6 times per day  . latanoprost  1 drop Both Eyes QHS  . pneumococcal 23 valent vaccine  0.5 mL Intramuscular Tomorrow-1000  . vancomycin  1,250 mg Intravenous Q24H   Continuous Infusions:   PRN Meds:.acetaminophen (TYLENOL) oral liquid 160 mg/5 mL, acetaminophen **OR** acetaminophen, ondansetron **OR** ondansetron (ZOFRAN) IV  Antibiotics    Anti-infectives    Start     Dose/Rate Route Frequency Ordered Stop   06/07/14 0600  ceFEPIme (MAXIPIME) 2 g in dextrose 5 % 50 mL IVPB     2 g100 mL/hr over 30 Minutes Intravenous Every 12 hours 06/07/14 0518     Mar 22, 2014 2300  vancomycin (VANCOCIN) 1,250 mg in sodium chloride 0.9 % 250 mL IVPB     1,250 mg166.7 mL/hr over 90 Minutes Intravenous Every 24 hours Mar 22, 2014 2226     Mar 22, 2014 2200  ceFEPIme (MAXIPIME) 1 g in dextrose 5 % 50 mL IVPB     1 g100 mL/hr over 30 Minutes Intravenous  Once Mar 22, 2014 2132 Mar 22, 2014 2249   Mar 22, 2014 2200  vancomycin (VANCOCIN) IVPB 1000 mg/200 mL premix  Status:  Discontinued     1,000 mg200 mL/hr over 60 Minutes Intravenous  Once Mar 22, 2014 2147 Mar 22, 2014 2225   Mar 22, 2014 2145  clindamycin (CLEOCIN) IVPB 900 mg     900 mg100 mL/hr over 30 Minutes Intravenous  Once Mar 22, 2014 2132 06/07/14 0007        Subjective:   Scott Caldwell seen and examined today.  Patient has advanced dementia and is noncommunicative at baseline.    Objective:   Filed Vitals:   06/07/14 2132 06/08/14 0550 06/08/14 0834 06/08/14 0915  BP: 131/58 115/58    Pulse: 106 105 106 104  Temp: 98.1 F (36.7 C) 100.6 F (38.1 C) 100.9 F (38.3 C)   TempSrc: Axillary Axillary Axillary   Resp: 40 36  34  Height:      Weight:      SpO2: 93% 93% 94% 93%    Wt Readings from Last 3 Encounters:  06/07/14 82.3 kg (181 lb 7 oz)  04/28/14 87.68 kg (193 lb 4.8 oz)  03/23/14 89.359 kg (197  lb)     Intake/Output Summary (Last 24 hours) at 06/08/14 1135 Last data filed at 06/08/14 [id number]  Gross per 24 hour  Intake    300 ml  Output   1150 ml  Net   -850 ml    Exam  General: Well developed, well nourished  HEENT: NCAT, mucous membranes moist. NG tube in place, on nonrebreather  Cardiovascular: S1 S2 auscultated, Tachycardic  Respiratory: Coarse breath sounds, tachypneic  Abdomen: Soft, nontender, distended, + bowel sounds  Extremities: warm dry without cyanosis clubbing  Data Review   Micro Results Recent Results (from the past 240 hour(s))  Blood culture (routine x 2)     Status: None (Preliminary result)   Collection Time: Mar 22, 2014  8:15 PM  Result Value Ref Range Status  Specimen Description BLOOD R HAND  Final   Special Requests BOTTLES DRAWN AEROBIC AND ANAEROBIC 2 ML EACH  Final   Culture  Setup Time   Final    06/07/2014 00:39 Performed at [facility]    Culture   Final           BLOOD CULTURE RECEIVED NO GROWTH TO DATE CULTURE WILL BE HELD FOR 5 DAYS BEFORE ISSUING A FINAL NEGATIVE REPORT Performed at [facility]    Report Status PENDING  Incomplete  Blood culture (routine x 2)     Status: None (Preliminary result)   Collection Time: 2014/06/11  8:20 PM  Result Value Ref Range Status   Specimen Description BLOOD R FOREARM  Final   Special Requests BOTTLES DRAWN AEROBIC AND ANAEROBIC 5 ML EACH  Final   Culture  Setup Time   Final    06/07/2014 00:39 Performed at [facility]    Culture   Final           BLOOD CULTURE RECEIVED NO GROWTH TO DATE CULTURE WILL BE HELD FOR 5 DAYS BEFORE ISSUING A FINAL NEGATIVE REPORT Performed at [facility]    Report Status PENDING  Incomplete  MRSA PCR Screening     Status: None   Collection Time: 06/07/14 12:46 AM  Result Value Ref Range Status   MRSA by PCR NEGATIVE NEGATIVE Final    Comment:        The GeneXpert MRSA Assay (FDA approved for NASAL specimens only), is  one component of a comprehensive MRSA colonization surveillance program. It is not intended to diagnose MRSA infection nor to guide or monitor treatment for MRSA infections.     Radiology Reports Ct Abdomen Pelvis Wo Contrast  06/07/2014   CLINICAL DATA:  Abdominal pain, distention, nausea and vomiting.  EXAM: CT ABDOMEN AND PELVIS WITHOUT CONTRAST  TECHNIQUE: Multidetector CT imaging of the abdomen and pelvis was performed following the standard protocol without IV contrast.  COMPARISON:  06/02/2008  FINDINGS: Lower chest: There is a small left pleural effusion. Airspace consolidation is identified in both lower lobes.  Hepatobiliary: No focal liver abnormality. The gallbladder appears normal. There is no biliary dilatation.  Pancreas: The pancreas appears normal.  Spleen: Normal appearance of the spleen.  Adrenals/Urinary Tract: The adrenal glands are both normal. Cyst within the upper pole the right kidney measures 3.9 cm. This is incompletely characterized without IV contrast. The previously noted cyst within the inferior pole of the right kidney has resolved. The left kidney appears normal. The urinary bladder is collapsed chronic Foley catheter.  Stomach/Bowel: Nasogastric tube tip is in the body of the moderately distended stomach. There is enteric contrast material within normal caliber small bowel loops. The terminal ileum is visualized and appears normal. There is contrast identified within the ascending colon. There is moderate stool identified within the thick walled rectum.  Vascular/Lymphatic: Mild calcified atherosclerotic disease involves the abdominal aorta. No aneurysm. Retro aortic left renal vein is identified. There is no retroperitoneal or mesenteric adenopathy. No pelvic or inguinal adenopathy.  Reproductive: Patient is status post prostatectomy and pelvic lymph node dissection.  Other: There is no ascites or focal fluid collections within the abdomen or pelvis.  Musculoskeletal:  There is no aggressive lytic or sclerotic bone lesions identified. The osseous structures appear osteopenic.  IMPRESSION: 1. Bilateral lower lobe airspace consolidation consistent with multifocal pneumonia. 2. No evidence for bowel obstruction. There it remains moderate distension of the gastric lumen.  However enteric contrast material is identified throughout the small bowel loops and into the proximal colon. 3. Moderate residual stool identified within the rectum. There is wall thickening of the rectum which may reflect sequelae of stercoral colitis 4. Atherosclerotic disease.   Electronically Signed   By: Signa Kell M.D.   On: 06/07/2014 11:07   Dg Abd 1 View  Jun 10, 2014   CLINICAL DATA:  Status post nasogastric placement  EXAM: ABDOMEN - 1 VIEW  COMPARISON:  Film from earlier in the same day.  FINDINGS: A nasogastric catheter is now noted within the stomach. There is been interval significant decompression of the stomach seen. Scattered large and small bowel gas is noted. No other focal abnormality is noted.  IMPRESSION: Decompression of the stomach following nasogastric catheter placement   Electronically Signed   By: Alcide Clever M.D.   On: 06/10/14 22:35   Dg Chest Port 1 View  06/10/2014   CLINICAL DATA:  Shortness of breath  EXAM: PORTABLE CHEST - 1 VIEW  COMPARISON:  07/27/2013  FINDINGS: Cardiac shadow is stable. Vascular congestion is noted as well as patchy infiltrates in the bases bilaterally slightly worse on the left than the right. No acute bony abnormality is noted.  IMPRESSION: Bibasilar infiltrates superimposed over mild vascular congestion. No interstitial edema is seen.   Electronically Signed   By: Alcide Clever M.D.   On: 2014-06-10 21:23   Dg Abd Portable 2v  06-10-14   CLINICAL DATA:  Abdominal distention, emesis  EXAM: PORTABLE ABDOMEN - 2 VIEW  COMPARISON:  06/02/2008  FINDINGS: Stomach is significantly distended. No free air is seen. A relative paucity of bowel gas is  noted in the remainder of the bowel. Fecal material is noted within the rectum. Postsurgical changes are seen.  IMPRESSION: Gaseous distension of the stomach of uncertain etiology.   Electronically Signed   By: Alcide Clever M.D.   On: 06-10-14 21:25    CBC  Recent Labs Lab Jun 10, 2014 2140 06/07/14 0545  WBC 1.1* 1.5*  HGB 14.4 13.5  HCT 43.0 41.5  PLT 145* 149*  MCV 88.3 88.7  MCH 29.6 28.8  MCHC 33.5 32.5  RDW 13.9 14.2  LYMPHSABS 0.2* 0.6*  MONOABS 0.1 0.2  EOSABS 0.0 0.0  BASOSABS 0.0 0.0    Chemistries   Recent Labs Lab 06-10-14 2140 06/07/14 0545 06/08/14 0500  NA 141 143 142  K 4.4 4.6 4.6  CL 103 107 106  CO2 21 16* 21  GLUCOSE 135* 108* 119*  BUN 27* 29* 32*  CREATININE 1.67* 1.43* 1.28  CALCIUM 9.0 8.2* 8.3*  AST 20 28  --   ALT 9 9  --   ALKPHOS 56 45  --   BILITOT 0.4 0.5  --    ------------------------------------------------------------------------------------------------------------------ estimated creatinine clearance is 47.7 mL/min (by C-G formula based on Cr of 1.28). ------------------------------------------------------------------------------------------------------------------ No results for input(s): HGBA1C in the last 72 hours. ------------------------------------------------------------------------------------------------------------------ No results for input(s): CHOL, HDL, LDLCALC, TRIG, CHOLHDL, LDLDIRECT in the last 72 hours. ------------------------------------------------------------------------------------------------------------------ No results for input(s): TSH, T4TOTAL, T3FREE, THYROIDAB in the last 72 hours.  Invalid input(s): FREET3 ------------------------------------------------------------------------------------------------------------------ No results for input(s): VITAMINB12, FOLATE, FERRITIN, TIBC, IRON, RETICCTPCT in the last 72 hours.  Coagulation profile No results for input(s): INR, PROTIME in the last 168  hours.  No results for input(s): DDIMER in the last 72 hours.  Cardiac Enzymes No results for input(s): CKMB, TROPONINI, MYOGLOBIN in the last 168 hours.  Invalid input(s): CK ------------------------------------------------------------------------------------------------------------------ Invalid  input(s): POCBNP    Lennon Boutwell D.O. on 06/08/2014 at 11:35 AM  Between 7am to 7pm - Pager - [phone]  After 7pm go to [email or web address] - password [email or web address]  And look for the night coverage person covering for me after hours  [facility] Office  [phone]

## 2014-06-08 NOTE — Progress Notes (Signed)
Received call from pt's RN to assess for NTS. Pt has rhonchi/crackles throughout including upper airway. HR 104, SpO2 93% on 100% NRB. Pt using accessory muscles with RR in the 30's. RT will attempt to NTS to try and ease pt's breathing. RT will continue to monitor.

## 2014-06-08 NOTE — Clinical Documentation Improvement (Signed)
  Query #1 Patient admitted with Sepsis and Aspiration Pneumonia.  Documented respiratory rate per CH[facility] has been in the 30's and 40's.  Currently requiring NRB to maintain 02 sats.  Possible Clinical Condition:   - Acute Respiratory Failure, including hypoxic and/or hypercarbic, if known.   - Other Condition   - Unable to Clinically Determine  Query #2 "Acute Renal Failure" documented in current medical record.  Please provide clinical indicators, including reference to previous labs and changes in labs this admission if appropriate, for the diagnosis of acute renal failure.  Thank You, Jerral Ralphathy R Dannon Nguyenthi ,RN Clinical Documentation Specialist:  [phone] [facility] Information Management

## 2014-06-13 LAB — CULTURE, BLOOD (ROUTINE X 2)
CULTURE: NO GROWTH
Culture: NO GROWTH

## 2014-06-19 NOTE — Progress Notes (Signed)
Late note: AT 0035 pt was found by RN with no HR and vital signs. Confirmation of pt expiration by Charge RN Alycia Rossetti. Ryan and pt pronounced dead. Attending T. Claiborne BillingsCallahan notified and arrived to unit shortly thereafter to fill out death certificate. Pts son Scott Caldwell had been in the waiting area and was notified at that time.  [place] donor services called and protocol followed. Funeral home info provided by son.

## 2014-06-19 NOTE — Discharge Summary (Addendum)
Death Summary  Scott Caldwell ZOX:[id number] DOB: 12/19/1926 DOA: 11-19-13  PCP: Pearson GrippeKIM, JAMES, MD PCP/Office notified:   Admit date: 11-19-13 Date of Death: 06/16/2014  Final Diagnoses:  Severe sepsis secondary to possible aspiration pneumonia versus intra-abdominal source Acute respiratory failure Gastric distention with nausea and vomiting Acute kidney injury Diabetes mellitus, type II next hypertension next advanced dementia  History of present illness:  on 06/07/2014 Scott Poehomas E Rolin is a 80 y.o. male with history of Alzheimer's dementia, diabetes mellitus, hypertension was brought to the ER after patient was found to have multiple episodes of nausea and vomiting. As per patient's son patient is usually noncommunicative given patient's advanced dementia and is bedbound. Patient's family noticed that patient had been having multiple episodes of nausea and vomiting yesterday. Patient was brought to the ER and in the ER, acute abdominal series done and showed gastric distention and chest x-ray showed bilateral infiltrates. Patient was found to be febrile and NG tube was placed following which repeat abdominal x-ray showed decompression of the gastric distention. Labs reveal severe leukopenia and mild thrombocytopenia possibly from developing sepsis. Patient did have a large bowel movement in the ER. Blood cultures were obtained and patient has been admitted for further management. Patient's family at this time after discussion has requested DO NOT RESUSCITATE status for the patient but is okay with continuing with IV fluids and antibiotics. Patient is noncommunicative and further history is not possible.  Hospital Course:  Patient was being treated for severe sepsis.  Spoke with patient's children on 06/08/2014 in the afternoon regarding transitioning patient to comfort care as patient's was in respiratory failure and tachypneic.  Children opted to continue treatment.  I spoke with patient's  PCP, Dr. Selena BattenKim yesterday afternoon. Patient passed overnight, however, there is no documentation regarding time.    Severe sepsis secondary to possible aspiration pneumonia versus intra-abdominal source -Patient was leukopenic, tachycardic, tachypneic and febrile upon admission -Chest x-ray showed bibasilar infiltrate superimposed over mild basilar congestion -CT of the abdomen: Bilateral lower lobe airspace consolidation consistent with multifocal pneumonia, no evidence of bowel obstruction, moderate residual stool identified, sterocoral colitis -Patient was started on broad-spectrum antibiotics with vancomycin and zosyn -UA: Negative for infection  Acute respiratory failure -Patient remained tachypneic and on nonrebreather -Patient's family made him DNR.  -Will continue to monitor -Possibly secondary to aspiration pneumonia  Gastric distention with nausea and vomiting -Patient had NG tube placed and set to suction -CT of the abdomen and pelvis: no evidence of bowel obstruction, moderate residual stool identified , sterocoral colitis -Enema was ordered  Severe leukopenia and mild thrombocytopenia -Likely secondary to sepsis -Will continue to monitor CBC  Acute kidney injury -Improving, Possibly secondary to dehydration from vomiting versus sepsis versus medications -Avoided nephrotoxic medications -Was placed on IVF  Diabetes mellitus, type II -Metformin currently held  Hypertension -Lisinopril currently held as patient's blood pressures have been soft and patient has AK I  Advanced dementia -Patient is noncommunicative at baseline  Severe malnutrition -given patient's dementia as well as history of bedbound status and muscle atrophy, patient is likely severely malnutritioned -nutrition consulted -diet was advanced, however given patient's sepsis and poor outlook, patient was unable to tolerate a diet or supplements.  Time: 15 minutes  Signed:  Edsel PetrinMIKHAIL, Sondos Wolfman  [facility] 05/28/2014, 12:09 PM

## 2014-06-19 DEATH — deceased
